# Patient Record
Sex: Male | Born: 1960 | Race: White | Hispanic: No | Marital: Married | Smoking: Former smoker
Health system: Southern US, Community
[De-identification: ages and names within clinical notes are randomized; demographics above are authoritative.]

## PROBLEM LIST (undated history)

## (undated) DIAGNOSIS — T4145XA Adverse effect of unspecified anesthetic, initial encounter: Secondary | ICD-10-CM

## (undated) DIAGNOSIS — I10 Essential (primary) hypertension: Secondary | ICD-10-CM

## (undated) DIAGNOSIS — T8859XA Other complications of anesthesia, initial encounter: Secondary | ICD-10-CM

## (undated) DIAGNOSIS — Z8719 Personal history of other diseases of the digestive system: Secondary | ICD-10-CM

## (undated) DIAGNOSIS — R112 Nausea with vomiting, unspecified: Secondary | ICD-10-CM

## (undated) DIAGNOSIS — I209 Angina pectoris, unspecified: Secondary | ICD-10-CM

## (undated) DIAGNOSIS — F419 Anxiety disorder, unspecified: Secondary | ICD-10-CM

## (undated) DIAGNOSIS — K219 Gastro-esophageal reflux disease without esophagitis: Secondary | ICD-10-CM

## (undated) DIAGNOSIS — Z9889 Other specified postprocedural states: Secondary | ICD-10-CM

## (undated) DIAGNOSIS — M1990 Unspecified osteoarthritis, unspecified site: Secondary | ICD-10-CM

## (undated) DIAGNOSIS — G4730 Sleep apnea, unspecified: Secondary | ICD-10-CM

## (undated) DIAGNOSIS — I2510 Atherosclerotic heart disease of native coronary artery without angina pectoris: Secondary | ICD-10-CM

## (undated) DIAGNOSIS — Z8489 Family history of other specified conditions: Secondary | ICD-10-CM

## (undated) DIAGNOSIS — E7800 Pure hypercholesterolemia, unspecified: Secondary | ICD-10-CM

## (undated) HISTORY — PX: COLONOSCOPY W/ POLYPECTOMY: SHX1380

## (undated) HISTORY — PX: ESOPHAGOGASTRODUODENOSCOPY: SHX1529

---

## 2004-10-19 ENCOUNTER — Other Ambulatory Visit: Payer: Self-pay

## 2004-10-19 ENCOUNTER — Ambulatory Visit: Payer: Self-pay | Admitting: Cardiology

## 2004-10-19 HISTORY — PX: CORONARY ANGIOPLASTY WITH STENT PLACEMENT: SHX49

## 2005-11-29 HISTORY — PX: CARDIAC CATHETERIZATION: SHX172

## 2006-04-26 ENCOUNTER — Ambulatory Visit: Payer: Self-pay | Admitting: Cardiology

## 2010-07-13 ENCOUNTER — Ambulatory Visit: Payer: Self-pay | Admitting: Gastroenterology

## 2010-07-15 LAB — PATHOLOGY REPORT

## 2011-10-06 ENCOUNTER — Ambulatory Visit: Payer: Self-pay

## 2013-06-15 ENCOUNTER — Ambulatory Visit: Payer: Self-pay | Admitting: Gastroenterology

## 2014-01-14 ENCOUNTER — Ambulatory Visit: Payer: Self-pay | Admitting: Internal Medicine

## 2014-05-07 ENCOUNTER — Ambulatory Visit: Payer: Self-pay | Admitting: Internal Medicine

## 2014-05-20 ENCOUNTER — Ambulatory Visit: Payer: Self-pay | Admitting: Internal Medicine

## 2014-07-03 ENCOUNTER — Other Ambulatory Visit: Payer: Self-pay | Admitting: Neurosurgery

## 2014-07-29 ENCOUNTER — Encounter (HOSPITAL_COMMUNITY): Payer: Self-pay | Admitting: Pharmacy Technician

## 2014-07-30 ENCOUNTER — Encounter (HOSPITAL_COMMUNITY)
Admission: RE | Admit: 2014-07-30 | Discharge: 2014-07-30 | Disposition: A | Discharge status: Home / Self Care | Payer: BC Managed Care – PPO | Source: Ambulatory Visit | Attending: Neurosurgery | Admitting: Neurosurgery

## 2014-07-30 ENCOUNTER — Encounter (HOSPITAL_COMMUNITY): Payer: Self-pay

## 2014-07-30 ENCOUNTER — Encounter (HOSPITAL_COMMUNITY)
Admission: RE | Admit: 2014-07-30 | Discharge: 2014-07-30 | Disposition: A | Discharge status: Home / Self Care | Payer: BC Managed Care – PPO | Source: Ambulatory Visit | Attending: Anesthesiology | Admitting: Anesthesiology

## 2014-07-30 DIAGNOSIS — Z01818 Encounter for other preprocedural examination: Secondary | ICD-10-CM | POA: Diagnosis present

## 2014-07-30 DIAGNOSIS — M5126 Other intervertebral disc displacement, lumbar region: Secondary | ICD-10-CM | POA: Insufficient documentation

## 2014-07-30 DIAGNOSIS — M47817 Spondylosis without myelopathy or radiculopathy, lumbosacral region: Secondary | ICD-10-CM | POA: Insufficient documentation

## 2014-07-30 DIAGNOSIS — M48061 Spinal stenosis, lumbar region without neurogenic claudication: Secondary | ICD-10-CM | POA: Diagnosis not present

## 2014-07-30 DIAGNOSIS — Q762 Congenital spondylolisthesis: Secondary | ICD-10-CM | POA: Insufficient documentation

## 2014-07-30 HISTORY — DX: Gastro-esophageal reflux disease without esophagitis: K21.9

## 2014-07-30 HISTORY — DX: Adverse effect of unspecified anesthetic, initial encounter: T41.45XA

## 2014-07-30 HISTORY — DX: Nausea with vomiting, unspecified: R11.2

## 2014-07-30 HISTORY — DX: Other specified postprocedural states: Z98.890

## 2014-07-30 HISTORY — DX: Family history of other specified conditions: Z84.89

## 2014-07-30 HISTORY — DX: Anxiety disorder, unspecified: F41.9

## 2014-07-30 HISTORY — DX: Angina pectoris, unspecified: I20.9

## 2014-07-30 HISTORY — DX: Unspecified osteoarthritis, unspecified site: M19.90

## 2014-07-30 HISTORY — DX: Other complications of anesthesia, initial encounter: T88.59XA

## 2014-07-30 HISTORY — DX: Essential (primary) hypertension: I10

## 2014-07-30 LAB — BASIC METABOLIC PANEL
ANION GAP: 17 — AB (ref 5–15)
BUN: 13 mg/dL (ref 6–23)
CO2: 22 meq/L (ref 19–32)
CREATININE: 0.68 mg/dL (ref 0.50–1.35)
Calcium: 9.8 mg/dL (ref 8.4–10.5)
Chloride: 101 mEq/L (ref 96–112)
GFR calc Af Amer: >90 mL/min (ref >90)
GFR calc non Af Amer: >90 mL/min (ref >90)
GLUCOSE: 90 mg/dL (ref 70–99)
Potassium: 4.2 mEq/L (ref 3.7–5.3)
Sodium: 140 mEq/L (ref 137–147)

## 2014-07-30 LAB — CBC
HCT: 45.8 % (ref 39.0–52.0)
HEMOGLOBIN: 15.2 g/dL (ref 13.0–17.0)
MCH: 30.5 pg (ref 26.0–34.0)
MCHC: 33.2 g/dL (ref 30.0–36.0)
MCV: 92 fL (ref 78.0–100.0)
Platelets: 215 10*3/uL (ref 150–400)
RBC: 4.98 MIL/uL (ref 4.22–5.81)
RDW: 12.8 % (ref 11.5–15.5)
WBC: 10.5 10*3/uL (ref 4.0–10.5)

## 2014-07-30 LAB — SURGICAL PCR SCREEN
MRSA, PCR: NEGATIVE
STAPHYLOCOCCUS AUREUS: POSITIVE — AB

## 2014-07-30 NOTE — Progress Notes (Addendum)
Keith Chan had cardiac stent in 2005.  Patient's cardiologist is Dr Josefa Half, patient is to stop Plavix and Aspirin 5 days prior to surgery.  I have requested last office note , EKGs Stress Test and Echo from his office.

## 2014-07-30 NOTE — Progress Notes (Signed)
07/30/14 1553  OBSTRUCTIVE SLEEP APNEA  Have you ever been diagnosed with sleep apnea through a sleep study? No  Do you snore loudly (loud enough to be heard through closed doors)?  1  Do you often feel tired, fatigued, or sleepy during the daytime? 1  Has anyone observed you stop breathing during your sleep? 0  Do you have, or are you being treated for high blood pressure? 1  BMI more than 35 kg/m2? 0  Age over 54 years old? 1  Neck circumference greater than 40 cm/16 inches? 1  Gender: 1  Obstructive Sleep Apnea Score 6  Score 4 or greater  Results sent to PCP

## 2014-07-30 NOTE — Pre-Procedure Instructions (Addendum)
[facility]  07/30/2014   Your procedure is scheduled on:  WEDNESDAY, SEPTEMBER 9.  Report to [facility] Admitting at 6:30AM.  Call this number if you have problems the morning of surgery: [phone]   Remember:   Do not eat food or drink liquids after midnight Tuesday, September 8.  Take these medicines the morning of surgery with A SIP OF WATER: atenolol (TENORMIN), pantoprazole (PROTONIX), amLODipine (NORVASC).       Do not wear jewelry, make-up or nail polish.  Do not wear lotions, powders, or perfumes.  Men may shave face and neck.  Do not bring valuables to the hospital.              [facility] is not responsible for any belongings or valuables.               Contacts, dentures or bridgework may not be worn into surgery.  Leave suitcase in the car. After surgery it may be brought to your room.  For patients admitted to the hospital, discharge time is determined by your treatment team.               Patients discharged the day of surgery will not be allowed to drive home.  Name and phone number of your driver: -   Special Instructions: Review  [facility] - Preparing For Surgery.   Please read over the following fact sheets that you were given: Pain Booklet, Coughing and Deep Breathing, Blood Transfusion Information and Surgical Site Infection Prevention

## 2014-08-06 NOTE — Anesthesia Preprocedure Evaluation (Addendum)
Anesthesia Evaluation  Patient identified by MRN, date of birth, ID band Patient awake    Reviewed: Allergy & Precautions, H&P , NPO status , Patient's Chart, lab work & pertinent test results  History of Anesthesia Complications (+) PONV  Airway Mallampati: II TM Distance: >3 FB Neck ROM: Full    Dental  (+) Dental Advisory Given, Teeth Intact   Pulmonary former smoker (quit 1982),  breath sounds clear to auscultation        Cardiovascular hypertension, Pt. on medications + angina (Stent 2005, on PLAVIX, to stop5 days prior, [facility]) Rhythm:Regular Rate:Normal     Neuro/Psych    GI/Hepatic GERD-  Medicated,  Endo/Other    Renal/GU      Musculoskeletal  (+) Arthritis -,   Abdominal (+)  Abdomen: soft.    Peds  Hematology   Anesthesia Other Findings   Reproductive/Obstetrics                          Anesthesia Physical Anesthesia Plan  ASA: III  Anesthesia Plan: General   Post-op Pain Management:    Induction: Intravenous  Airway Management Planned: Oral ETT  Additional Equipment:   Intra-op Plan:   Post-operative Plan: Extubation in OR  Informed Consent: I have reviewed the patients History and Physical, chart, labs and discussed the procedure including the risks, benefits and alternatives for the proposed anesthesia with the patient or authorized representative who has indicated his/her understanding and acceptance.     Plan Discussed with:   Anesthesia Plan Comments:         Anesthesia Quick Evaluation

## 2014-08-07 ENCOUNTER — Encounter (HOSPITAL_COMMUNITY): Payer: BC Managed Care – PPO | Admitting: Anesthesiology

## 2014-08-07 ENCOUNTER — Inpatient Hospital Stay (HOSPITAL_COMMUNITY): Payer: BC Managed Care – PPO | Admitting: Anesthesiology

## 2014-08-07 ENCOUNTER — Encounter (HOSPITAL_COMMUNITY)
Admission: RE | Disposition: A | Discharge status: Home / Self Care | Payer: BC Managed Care – PPO | Source: Ambulatory Visit | Attending: Neurosurgery

## 2014-08-07 ENCOUNTER — Encounter (HOSPITAL_COMMUNITY): Payer: Self-pay | Admitting: *Deleted

## 2014-08-07 ENCOUNTER — Inpatient Hospital Stay (HOSPITAL_COMMUNITY): Payer: BC Managed Care – PPO

## 2014-08-07 ENCOUNTER — Inpatient Hospital Stay (HOSPITAL_COMMUNITY)
Admission: RE | Admit: 2014-08-07 | Discharge: 2014-08-10 | DRG: 460 | Disposition: A | Discharge status: Home / Self Care | Payer: BC Managed Care – PPO | Source: Ambulatory Visit | Attending: Neurosurgery | Admitting: Neurosurgery

## 2014-08-07 DIAGNOSIS — M47817 Spondylosis without myelopathy or radiculopathy, lumbosacral region: Secondary | ICD-10-CM | POA: Diagnosis present

## 2014-08-07 DIAGNOSIS — Z87891 Personal history of nicotine dependence: Secondary | ICD-10-CM | POA: Diagnosis not present

## 2014-08-07 DIAGNOSIS — M5126 Other intervertebral disc displacement, lumbar region: Secondary | ICD-10-CM | POA: Diagnosis present

## 2014-08-07 DIAGNOSIS — Z79899 Other long term (current) drug therapy: Secondary | ICD-10-CM | POA: Diagnosis not present

## 2014-08-07 DIAGNOSIS — I10 Essential (primary) hypertension: Secondary | ICD-10-CM | POA: Diagnosis present

## 2014-08-07 DIAGNOSIS — Z7902 Long term (current) use of antithrombotics/antiplatelets: Secondary | ICD-10-CM

## 2014-08-07 DIAGNOSIS — M129 Arthropathy, unspecified: Secondary | ICD-10-CM | POA: Diagnosis present

## 2014-08-07 DIAGNOSIS — Z9861 Coronary angioplasty status: Secondary | ICD-10-CM | POA: Diagnosis not present

## 2014-08-07 DIAGNOSIS — Z7982 Long term (current) use of aspirin: Secondary | ICD-10-CM

## 2014-08-07 DIAGNOSIS — F411 Generalized anxiety disorder: Secondary | ICD-10-CM | POA: Diagnosis present

## 2014-08-07 DIAGNOSIS — Q762 Congenital spondylolisthesis: Secondary | ICD-10-CM

## 2014-08-07 DIAGNOSIS — K219 Gastro-esophageal reflux disease without esophagitis: Secondary | ICD-10-CM | POA: Diagnosis present

## 2014-08-07 DIAGNOSIS — Z885 Allergy status to narcotic agent status: Secondary | ICD-10-CM | POA: Diagnosis not present

## 2014-08-07 DIAGNOSIS — M48061 Spinal stenosis, lumbar region without neurogenic claudication: Secondary | ICD-10-CM | POA: Diagnosis present

## 2014-08-07 HISTORY — PX: POSTERIOR LUMBAR FUSION: SHX6036

## 2014-08-07 HISTORY — DX: Pure hypercholesterolemia, unspecified: E78.00

## 2014-08-07 HISTORY — DX: Personal history of other diseases of the digestive system: Z87.19

## 2014-08-07 LAB — TYPE AND SCREEN
ABO/RH(D): O POS
ANTIBODY SCREEN: NEGATIVE

## 2014-08-07 LAB — ABO/RH: ABO/RH(D): O POS

## 2014-08-07 SURGERY — POSTERIOR LUMBAR FUSION 2 LEVEL
Anesthesia: General | Site: Back

## 2014-08-07 MED ORDER — PROMETHAZINE HCL 25 MG/ML IJ SOLN: 6.2500 mg | INTRAMUSCULAR | Status: DC | PRN

## 2014-08-07 MED ORDER — LACTATED RINGERS IV SOLN
INTRAVENOUS | Status: DC | PRN
  Administered 2014-08-07 (×2): via INTRAVENOUS

## 2014-08-07 MED ORDER — PANTOPRAZOLE SODIUM 40 MG PO TBEC
40.0000 mg | DELAYED_RELEASE_TABLET | Freq: Every day | ORAL | Status: DC
  Administered 2014-08-08 – 2014-08-10 (×3): 40 mg via ORAL
  Filled 2014-08-07 (×3): qty 1

## 2014-08-07 MED ORDER — ROCURONIUM BROMIDE 50 MG/5ML IV SOLN
INTRAVENOUS | Status: AC
  Filled 2014-08-07: qty 2

## 2014-08-07 MED ORDER — MIDAZOLAM HCL 2 MG/2ML IJ SOLN
INTRAMUSCULAR | Status: AC
  Filled 2014-08-07: qty 2

## 2014-08-07 MED ORDER — ONDANSETRON HCL 4 MG/2ML IJ SOLN: INTRAMUSCULAR | Status: DC | PRN

## 2014-08-07 MED ORDER — LIDOCAINE HCL (CARDIAC) 20 MG/ML IV SOLN
INTRAVENOUS | Status: AC
Start: 2014-08-07 — End: 2014-08-07
  Filled 2014-08-07: qty 5

## 2014-08-07 MED ORDER — ARTIFICIAL TEARS OP OINT
TOPICAL_OINTMENT | OPHTHALMIC | Status: DC | PRN
  Administered 2014-08-07: 1 via OPHTHALMIC

## 2014-08-07 MED ORDER — AMLODIPINE BESYLATE 5 MG PO TABS
5.0000 mg | ORAL_TABLET | Freq: Every day | ORAL | Status: DC
  Administered 2014-08-07 – 2014-08-10 (×4): 5 mg via ORAL
  Filled 2014-08-07 (×4): qty 1

## 2014-08-07 MED ORDER — PHENOL 1.4 % MT LIQD: 1.0000 | OROMUCOSAL | Status: DC | PRN

## 2014-08-07 MED ORDER — ROCURONIUM BROMIDE 100 MG/10ML IV SOLN
INTRAVENOUS | Status: DC | PRN
  Administered 2014-08-07 (×2): 10 mg via INTRAVENOUS
  Administered 2014-08-07: 20 mg via INTRAVENOUS
  Administered 2014-08-07: 10 mg via INTRAVENOUS
  Administered 2014-08-07: 50 mg via INTRAVENOUS
  Administered 2014-08-07: 10 mg via INTRAVENOUS

## 2014-08-07 MED ORDER — ROCURONIUM BROMIDE 50 MG/5ML IV SOLN
INTRAVENOUS | Status: AC
Start: 2014-08-07 — End: 2014-08-07
  Filled 2014-08-07: qty 2

## 2014-08-07 MED ORDER — LIDOCAINE HCL (CARDIAC) 20 MG/ML IV SOLN
INTRAVENOUS | Status: DC | PRN
  Administered 2014-08-07: 100 mg via INTRAVENOUS

## 2014-08-07 MED ORDER — FENTANYL CITRATE 0.05 MG/ML IJ SOLN
INTRAMUSCULAR | Status: AC
  Administered 2014-08-07: 50 ug via INTRAVENOUS
  Filled 2014-08-07: qty 2

## 2014-08-07 MED ORDER — PHENYLEPHRINE HCL 10 MG/ML IJ SOLN
INTRAMUSCULAR | Status: DC | PRN
  Administered 2014-08-07 (×2): 80 ug via INTRAVENOUS
  Administered 2014-08-07: 120 ug via INTRAVENOUS

## 2014-08-07 MED ORDER — LIDOCAINE-EPINEPHRINE 1 %-1:100000 IJ SOLN
INTRAMUSCULAR | Status: DC | PRN
  Administered 2014-08-07: 10 mL via INTRADERMAL

## 2014-08-07 MED ORDER — DEXAMETHASONE SODIUM PHOSPHATE 10 MG/ML IJ SOLN: INTRAMUSCULAR | Status: DC | PRN

## 2014-08-07 MED ORDER — FENTANYL CITRATE 0.05 MG/ML IJ SOLN
25.0000 ug | INTRAMUSCULAR | Status: DC | PRN
  Administered 2014-08-07: 50 ug via INTRAVENOUS

## 2014-08-07 MED ORDER — SODIUM CHLORIDE 0.9 % IR SOLN
Status: DC | PRN
  Administered 2014-08-07: 10:00:00

## 2014-08-07 MED ORDER — ONDANSETRON HCL 4 MG/2ML IJ SOLN: 4.0000 mg | INTRAMUSCULAR | Status: DC | PRN

## 2014-08-07 MED ORDER — NEOSTIGMINE METHYLSULFATE 10 MG/10ML IV SOLN
INTRAVENOUS | Status: AC
  Filled 2014-08-07: qty 1

## 2014-08-07 MED ORDER — HYDROMORPHONE HCL PF 1 MG/ML IJ SOLN
0.5000 mg | INTRAMUSCULAR | Status: DC | PRN
  Administered 2014-08-07 (×2): 1 mg via INTRAVENOUS
  Filled 2014-08-07 (×2): qty 1

## 2014-08-07 MED ORDER — SODIUM CHLORIDE 0.9 % IJ SOLN: 3.0000 mL | INTRAMUSCULAR | Status: DC | PRN

## 2014-08-07 MED ORDER — OXYCODONE-ACETAMINOPHEN 5-325 MG PO TABS: 1.0000 | ORAL_TABLET | ORAL | Status: DC | PRN

## 2014-08-07 MED ORDER — CEFAZOLIN SODIUM 1-5 GM-% IV SOLN
1.0000 g | Freq: Three times a day (TID) | INTRAVENOUS | Status: AC
  Administered 2014-08-07 – 2014-08-09 (×6): 1 g via INTRAVENOUS
  Filled 2014-08-07 (×9): qty 50

## 2014-08-07 MED ORDER — CLOPIDOGREL BISULFATE 75 MG PO TABS
75.0000 mg | ORAL_TABLET | Freq: Every day | ORAL | Status: DC
  Administered 2014-08-08 – 2014-08-10 (×3): 75 mg via ORAL
  Filled 2014-08-07 (×3): qty 1

## 2014-08-07 MED ORDER — ONDANSETRON HCL 4 MG/2ML IJ SOLN
INTRAMUSCULAR | Status: AC
  Filled 2014-08-07: qty 4

## 2014-08-07 MED ORDER — SODIUM CHLORIDE 0.9 % IJ SOLN
3.0000 mL | Freq: Two times a day (BID) | INTRAMUSCULAR | Status: DC
  Administered 2014-08-08 – 2014-08-09 (×3): 3 mL via INTRAVENOUS

## 2014-08-07 MED ORDER — MIDAZOLAM HCL 5 MG/5ML IJ SOLN
INTRAMUSCULAR | Status: DC | PRN
  Administered 2014-08-07: 2 mg via INTRAVENOUS

## 2014-08-07 MED ORDER — SODIUM CHLORIDE 0.9 % IV SOLN
250.0000 mL | INTRAVENOUS | Status: DC
Start: 2014-08-07 — End: 2014-08-10

## 2014-08-07 MED ORDER — FENTANYL CITRATE 0.05 MG/ML IJ SOLN
INTRAMUSCULAR | Status: AC
  Filled 2014-08-07: qty 5

## 2014-08-07 MED ORDER — GLYCOPYRROLATE 0.2 MG/ML IJ SOLN
INTRAMUSCULAR | Status: DC | PRN
  Administered 2014-08-07: 0.6 mg via INTRAVENOUS

## 2014-08-07 MED ORDER — NEOSTIGMINE METHYLSULFATE 10 MG/10ML IV SOLN
INTRAVENOUS | Status: DC | PRN
  Administered 2014-08-07: 4 mg via INTRAVENOUS

## 2014-08-07 MED ORDER — ATENOLOL 100 MG PO TABS
100.0000 mg | ORAL_TABLET | Freq: Every day | ORAL | Status: DC
  Administered 2014-08-08 – 2014-08-10 (×3): 100 mg via ORAL
  Filled 2014-08-07 (×3): qty 1

## 2014-08-07 MED ORDER — SCOPOLAMINE 1 MG/3DAYS TD PT72
1.0000 | MEDICATED_PATCH | TRANSDERMAL | Status: DC
  Administered 2014-08-07: 1.5 mg via TRANSDERMAL

## 2014-08-07 MED ORDER — PROPOFOL 10 MG/ML IV BOLUS
INTRAVENOUS | Status: DC | PRN
  Administered 2014-08-07: 150 mg via INTRAVENOUS
  Administered 2014-08-07: 50 mg via INTRAVENOUS

## 2014-08-07 MED ORDER — CEFAZOLIN SODIUM-DEXTROSE 2-3 GM-% IV SOLR
INTRAVENOUS | Status: AC
  Administered 2014-08-07: 2 g via INTRAVENOUS
  Filled 2014-08-07: qty 50

## 2014-08-07 MED ORDER — DEXMEDETOMIDINE HCL IN NACL 200 MCG/50ML IV SOLN
INTRAVENOUS | Status: AC
  Filled 2014-08-07: qty 50

## 2014-08-07 MED ORDER — ACETAMINOPHEN 10 MG/ML IV SOLN
INTRAVENOUS | Status: AC
  Administered 2014-08-07: 1000 mg via INTRAVENOUS
  Filled 2014-08-07: qty 100

## 2014-08-07 MED ORDER — PROPOFOL 10 MG/ML IV BOLUS
INTRAVENOUS | Status: AC
Start: 2014-08-07 — End: 2014-08-07
  Filled 2014-08-07: qty 20

## 2014-08-07 MED ORDER — ASPIRIN 81 MG PO CHEW
81.0000 mg | CHEWABLE_TABLET | Freq: Every day | ORAL | Status: DC
  Administered 2014-08-08 – 2014-08-10 (×3): 81 mg via ORAL
  Filled 2014-08-07 (×3): qty 1

## 2014-08-07 MED ORDER — DEXMEDETOMIDINE HCL 200 MCG/2ML IV SOLN
INTRAVENOUS | Status: DC | PRN
  Administered 2014-08-07: 12 ug via INTRAVENOUS
  Administered 2014-08-07 (×2): 8 ug via INTRAVENOUS
  Administered 2014-08-07: 4 ug via INTRAVENOUS
  Administered 2014-08-07: 8 ug via INTRAVENOUS

## 2014-08-07 MED ORDER — DEXAMETHASONE SODIUM PHOSPHATE 10 MG/ML IJ SOLN
INTRAMUSCULAR | Status: AC
  Administered 2014-08-07: 10 mg via INTRAVENOUS
  Filled 2014-08-07: qty 1

## 2014-08-07 MED ORDER — PHENYLEPHRINE HCL 10 MG/ML IJ SOLN
10.0000 mg | INTRAMUSCULAR | Status: DC | PRN
  Administered 2014-08-07: 20 ug/min via INTRAVENOUS

## 2014-08-07 MED ORDER — ATORVASTATIN CALCIUM 10 MG PO TABS
20.0000 mg | ORAL_TABLET | Freq: Every day | ORAL | Status: DC
  Administered 2014-08-08 – 2014-08-09 (×2): 20 mg via ORAL
  Filled 2014-08-07 (×2): qty 2

## 2014-08-07 MED ORDER — ARTIFICIAL TEARS OP OINT
TOPICAL_OINTMENT | OPHTHALMIC | Status: AC
Start: 2014-08-07 — End: 2014-08-07
  Filled 2014-08-07: qty 3.5

## 2014-08-07 MED ORDER — FENTANYL CITRATE 0.05 MG/ML IJ SOLN
INTRAMUSCULAR | Status: DC | PRN
  Administered 2014-08-07 (×2): 50 ug via INTRAVENOUS
  Administered 2014-08-07: 100 ug via INTRAVENOUS
  Administered 2014-08-07: 50 ug via INTRAVENOUS

## 2014-08-07 MED ORDER — SCOPOLAMINE 1 MG/3DAYS TD PT72
MEDICATED_PATCH | TRANSDERMAL | Status: AC
  Filled 2014-08-07: qty 1

## 2014-08-07 MED ORDER — 0.9 % SODIUM CHLORIDE (POUR BTL) OPTIME
TOPICAL | Status: DC | PRN
  Administered 2014-08-07: 1000 mL

## 2014-08-07 MED ORDER — ONDANSETRON HCL 4 MG/2ML IJ SOLN
INTRAMUSCULAR | Status: DC | PRN
  Administered 2014-08-07 (×2): 4 mg via INTRAVENOUS

## 2014-08-07 MED ORDER — ACETAMINOPHEN 325 MG PO TABS
650.0000 mg | ORAL_TABLET | ORAL | Status: DC | PRN
Start: 2014-08-07 — End: 2014-08-10

## 2014-08-07 MED ORDER — ACETAMINOPHEN 650 MG RE SUPP: 650.0000 mg | RECTAL | Status: DC | PRN

## 2014-08-07 MED ORDER — MEPERIDINE HCL 25 MG/ML IJ SOLN: 6.2500 mg | INTRAMUSCULAR | Status: DC | PRN

## 2014-08-07 MED ORDER — HYDROMORPHONE HCL 2 MG PO TABS
1.0000 mg | ORAL_TABLET | ORAL | Status: DC | PRN
  Administered 2014-08-07 – 2014-08-10 (×12): 2 mg via ORAL
  Filled 2014-08-07 (×13): qty 1

## 2014-08-07 MED ORDER — CYCLOBENZAPRINE HCL 10 MG PO TABS
10.0000 mg | ORAL_TABLET | Freq: Three times a day (TID) | ORAL | Status: DC | PRN
  Administered 2014-08-08 – 2014-08-10 (×4): 10 mg via ORAL
  Filled 2014-08-07 (×4): qty 1

## 2014-08-07 MED ORDER — MENTHOL 3 MG MT LOZG: 1.0000 | LOZENGE | OROMUCOSAL | Status: DC | PRN

## 2014-08-07 MED ORDER — THROMBIN 20000 UNITS EX SOLR
CUTANEOUS | Status: DC | PRN
  Administered 2014-08-07 (×2): via TOPICAL

## 2014-08-07 MED ORDER — GLYCOPYRROLATE 0.2 MG/ML IJ SOLN
INTRAMUSCULAR | Status: AC
  Filled 2014-08-07: qty 3

## 2014-08-07 SURGICAL SUPPLY — 80 items
ALLOSTEM STRIP 20MMX50MM (Tissue) ×3 IMPLANT
BAG DECANTER FOR FLEXI CONT (MISCELLANEOUS) ×3 IMPLANT
BENZOIN TINCTURE PRP APPL 2/3 (GAUZE/BANDAGES/DRESSINGS) ×3 IMPLANT
BIT DRILL 5.0/4.0 (BIT) ×1 IMPLANT
BLADE SURG 11 STRL SS (BLADE) ×3 IMPLANT
BLADE SURG ROTATE 9660 (MISCELLANEOUS) IMPLANT
BONE ALLOSTEM MORSELIZED 5CC (Bone Implant) ×3 IMPLANT
BRUSH SCRUB EZ PLAIN DRY (MISCELLANEOUS) ×3 IMPLANT
BUR MATCHSTICK NEURO 3.0 LAGG (BURR) ×3 IMPLANT
BUR PRECISION FLUTE 6.0 (BURR) ×3 IMPLANT
CAGE RISE 11-17-15 10X26 (Cage) ×12 IMPLANT
CANISTER SUCT 3000ML (MISCELLANEOUS) ×3 IMPLANT
CAP LOCKING (Cap) ×12 IMPLANT
CAP LOCKING 5.5 CREO (Cap) ×6 IMPLANT
CLOSURE WOUND 1/2 X4 (GAUZE/BANDAGES/DRESSINGS) ×1
CONT SPEC 4OZ CLIKSEAL STRL BL (MISCELLANEOUS) ×6 IMPLANT
COVER BACK TABLE 24X17X13 BIG (DRAPES) IMPLANT
COVER TABLE BACK 60X90 (DRAPES) ×3 IMPLANT
DECANTER SPIKE VIAL GLASS SM (MISCELLANEOUS) ×3 IMPLANT
DERMABOND ADVANCED (GAUZE/BANDAGES/DRESSINGS) ×2
DERMABOND ADVANCED .7 DNX12 (GAUZE/BANDAGES/DRESSINGS) ×1 IMPLANT
DRAPE C-ARM 42X72 X-RAY (DRAPES) ×3 IMPLANT
DRAPE C-ARMOR (DRAPES) ×3 IMPLANT
DRAPE LAPAROTOMY 100X72X124 (DRAPES) ×3 IMPLANT
DRAPE POUCH INSTRU U-SHP 10X18 (DRAPES) ×3 IMPLANT
DRAPE PROXIMA HALF (DRAPES) IMPLANT
DRAPE SURG 17X23 STRL (DRAPES) ×3 IMPLANT
DRILL 5.0/4.0 (BIT) ×3
DRSG OPSITE 4X5.5 SM (GAUZE/BANDAGES/DRESSINGS) ×3 IMPLANT
DRSG OPSITE POSTOP 4X6 (GAUZE/BANDAGES/DRESSINGS) ×3 IMPLANT
DURAPREP 26ML APPLICATOR (WOUND CARE) ×3 IMPLANT
ELECT REM PT RETURN 9FT ADLT (ELECTROSURGICAL) ×3
ELECTRODE REM PT RTRN 9FT ADLT (ELECTROSURGICAL) ×1 IMPLANT
EVACUATOR 3/16  PVC DRAIN (DRAIN) ×2
EVACUATOR 3/16 PVC DRAIN (DRAIN) ×1 IMPLANT
GAUZE SPONGE 4X4 12PLY STRL (GAUZE/BANDAGES/DRESSINGS) ×3 IMPLANT
GAUZE SPONGE 4X4 16PLY XRAY LF (GAUZE/BANDAGES/DRESSINGS) ×3 IMPLANT
GLOVE BIO SURGEON STRL SZ8 (GLOVE) ×6 IMPLANT
GLOVE BIOGEL PI IND STRL 7.5 (GLOVE) ×2 IMPLANT
GLOVE BIOGEL PI INDICATOR 7.5 (GLOVE) ×4
GLOVE ECLIPSE 6.5 STRL STRAW (GLOVE) ×3 IMPLANT
GLOVE ECLIPSE 7.5 STRL STRAW (GLOVE) ×6 IMPLANT
GLOVE EXAM NITRILE LRG STRL (GLOVE) IMPLANT
GLOVE EXAM NITRILE MD LF STRL (GLOVE) IMPLANT
GLOVE EXAM NITRILE XL STR (GLOVE) IMPLANT
GLOVE EXAM NITRILE XS STR PU (GLOVE) IMPLANT
GLOVE INDICATOR 8.5 STRL (GLOVE) ×6 IMPLANT
GLOVE SURG SS PI 7.0 STRL IVOR (GLOVE) ×6 IMPLANT
GOWN STRL REUS W/ TWL LRG LVL3 (GOWN DISPOSABLE) ×2 IMPLANT
GOWN STRL REUS W/ TWL XL LVL3 (GOWN DISPOSABLE) ×3 IMPLANT
GOWN STRL REUS W/TWL 2XL LVL3 (GOWN DISPOSABLE) IMPLANT
GOWN STRL REUS W/TWL LRG LVL3 (GOWN DISPOSABLE) ×4
GOWN STRL REUS W/TWL XL LVL3 (GOWN DISPOSABLE) ×6
KIT BASIN OR (CUSTOM PROCEDURE TRAY) ×3 IMPLANT
KIT ROOM TURNOVER OR (KITS) ×3 IMPLANT
MILL MEDIUM DISP (BLADE) ×3 IMPLANT
NEEDLE HYPO 25X1 1.5 SAFETY (NEEDLE) ×3 IMPLANT
NS IRRIG 1000ML POUR BTL (IV SOLUTION) ×3 IMPLANT
PACK LAMINECTOMY NEURO (CUSTOM PROCEDURE TRAY) ×3 IMPLANT
PAD ARMBOARD 7.5X6 YLW CONV (MISCELLANEOUS) ×9 IMPLANT
PATTIES SURGICAL 1X1 (DISPOSABLE) ×3 IMPLANT
ROD 65MM SPINAL (Rod) ×2 IMPLANT
ROD SPNL 5.5 CREO TI 65 (Rod) ×1 IMPLANT
SCREW CORT CREO 6.0-5.0X30MM (Screw) ×6 IMPLANT
SCREW CORT CREO 6.5-5.5X35 (Screw) ×6 IMPLANT
SCREW MOD 6.0-5.0X35MM (Screw) ×6 IMPLANT
SPONGE LAP 4X18 X RAY DECT (DISPOSABLE) IMPLANT
SPONGE SURGIFOAM ABS GEL 100 (HEMOSTASIS) ×6 IMPLANT
STRIP CLOSURE SKIN 1/2X4 (GAUZE/BANDAGES/DRESSINGS) ×2 IMPLANT
SUT VIC AB 0 CT1 18XCR BRD8 (SUTURE) ×1 IMPLANT
SUT VIC AB 0 CT1 8-18 (SUTURE) ×2
SUT VIC AB 2-0 CT1 18 (SUTURE) ×3 IMPLANT
SUT VICRYL 4-0 PS2 18IN ABS (SUTURE) ×3 IMPLANT
SYR 20ML ECCENTRIC (SYRINGE) ×3 IMPLANT
TOWEL OR 17X24 6PK STRL BLUE (TOWEL DISPOSABLE) ×3 IMPLANT
TOWEL OR 17X26 10 PK STRL BLUE (TOWEL DISPOSABLE) ×3 IMPLANT
TRAY FOLEY CATH 14FRSI W/METER (CATHETERS) IMPLANT
TRAY FOLEY CATH 16FRSI W/METER (SET/KITS/TRAYS/PACK) ×3 IMPLANT
TULIP CREP AMP 5.5MM (Orthopedic Implant) ×6 IMPLANT
WATER STERILE IRR 1000ML POUR (IV SOLUTION) ×3 IMPLANT

## 2014-08-07 NOTE — H&P (Signed)
Keith Chan is an 53 y.o. male.   Chief Complaint: Back and right greater left leg pain HPI: Patient is a pleasant 53 to them is a progress worsening back and bilateral leg pain worse on the radiating down the back and outside of his thigh back and outside of his calf and his foot both involving the top as it is big toe as well as the outside in bottom of his foot. This is been refractory to all forms of conservative treatment and including physical therapy epidural steroid injections anti-inflammatories and narcotic pain management and time. Workup revealed spondylosis and disc disease at L4-5 L5-S1 with a grade 1 spondylolisthesis at L5-S1. It also revealed transitional anatomy with a router disc that were: S1-S2 disc. So due to this takes her treatment imaging findings and progression of clinical syndrome I recommended decompression and fusion to the spaces above the transitional level that we are going to call L4-5 and L5-S1. I have extensively reviewed the risks and benefits of the operation the patient as well as perioperative course expectations of outcome and alternatives to surgery and he understands and agrees to proceed forward.  Past Medical History  Diagnosis Date  . Complication of anesthesia   . PONV (postoperative nausea and vomiting)   . Family history of anesthesia complication     Mother- N/V  . Anginal pain   . Hypertension   . Anxiety   . GERD (gastroesophageal reflux disease)   . Arthritis     Past Surgical History  Procedure Laterality Date  . Cardiac catheterization    . Coronary angioplasty    . Colonoscopy w/ polypectomy    . Coronary angioplasty with stent placement  10/19/04    MId- Prox RCA  @ [facility]    History reviewed. No pertinent family history. Social History:  reports that he has quit smoking. He does not have any smokeless tobacco history on file. He reports that he drinks about 14.4 ounces of alcohol per week. He reports that he does not use illicit  drugs.  Allergies:  Allergies  Allergen Reactions  . Codeine Anaphylaxis    "throat closes up"    Medications Prior to Admission  Medication Sig Dispense Refill  . amLODipine (NORVASC) 5 MG tablet Take 5 mg by mouth daily.      Marland Kitchen aspirin 81 MG chewable tablet Chew 81 mg by mouth daily.      Marland Kitchen atenolol (TENORMIN) 100 MG tablet Take 100 mg by mouth daily.      Marland Kitchen atorvastatin (LIPITOR) 20 MG tablet Take 20 mg by mouth daily.      . clopidogrel (PLAVIX) 75 MG tablet Take 75 mg by mouth daily.      Marland Kitchen ibuprofen (ADVIL,MOTRIN) 200 MG tablet Take 400 mg by mouth 3 (three) times daily as needed for mild pain or moderate pain.      . pantoprazole (PROTONIX) 40 MG tablet Take 40 mg by mouth daily.        No results found for this or any previous visit (from the past 48 hour(s)). No results found.  Review of Systems  Constitutional: Negative.   HENT: Negative.   Eyes: Negative.   Respiratory: Negative.   Cardiovascular: Negative.   Gastrointestinal: Negative.   Genitourinary: Negative.   Musculoskeletal: Positive for back pain and myalgias.  Skin: Negative.   Neurological: Negative.   Psychiatric/Behavioral: Negative.     Blood pressure 167/98, pulse 75, temperature 98.6 F (37 C), temperature source Oral, resp.  rate 18, weight 75.297 kg (166 lb), SpO2 97.00%. Physical Exam  Constitutional: He is oriented to person, place, and time. He appears well-developed and well-nourished.  HENT:  Head: Normocephalic.  Eyes: Pupils are equal, round, and reactive to light.  Neck: Normal range of motion.  Cardiovascular: Normal rate.   Respiratory: Effort normal.  GI: Soft.  Neurological: He is alert and oriented to person, place, and time. He has normal strength. GCS eye subscore is 4. GCS verbal subscore is 5. GCS motor subscore is 6.  Strength is 5 out of 5 in his iliopsoas, quads, and she's, gastrocs, into tibialis, and EHL.  Skin: Skin is warm and dry.     Assessment/Plan 53 year old  gentleman presents for an L4-5 L5-S1 decompression and fusion  Connell Bognar P 08/07/2014, 8:23 AM

## 2014-08-07 NOTE — Anesthesia Procedure Notes (Signed)
Procedure Name: Intubation Date/Time: 08/07/2014 8:41 AM Performed by: Trixie Deis A Pre-anesthesia Checklist: Patient identified, Timeout performed and Emergency Drugs available Patient Re-evaluated:Patient Re-evaluated prior to inductionOxygen Delivery Method: Circle system utilized Preoxygenation: Pre-oxygenation with 100% oxygen Intubation Type: IV induction Ventilation: Mask ventilation without difficulty Laryngoscope Size: Mac and 3 Grade View: Grade I Tube type: Oral Tube size: 7.5 mm Number of attempts: 1 Airway Equipment and Method: Stylet Placement Confirmation: ETT inserted through vocal cords under direct vision,  breath sounds checked- equal and bilateral and positive ETCO2 Secured at: 23 cm Tube secured with: Tape Dental Injury: Teeth and Oropharynx as per pre-operative assessment and Injury to lip

## 2014-08-07 NOTE — Op Note (Signed)
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 with spondylosis stenosis and spondylolisthesis at L5-S1 with severe foraminal stenosis at L4-5 L5-S1  Postoperative diagnosis: Same  Procedure: Decompressive lumbar laminectomies L4-5 and L5-S1 complete medial facetectomies radical foraminotomies of the L4, L5, and S1 nerve root in excess and requiring more work than would be needed with a standard interbody fusion  #2 posterior lumbar interbody fusion L4-5 L5-S1 using the globus rise expandable peek cages packed with local autograft mixed with allostem morsels and strips  #3 pedicle screw fixation using the globus cortical Creo screw system from L4-S1  #4 placement of a large Hemovac drain  Surgeon: Dominica Severin Burnice Oestreicher  Assistant: Dr. Ashok Pall  Anesthesia: Gen.  EBL: Minimal  History of present illness: 53 year old gentleman has had long-standing back and bilateral leg pain worse on the right refractory to all forms of conservative treatment workup revealed severe stenosis at L4-5 and L5-S1 with a grade 1 spinal listhesis L5-S1 and severe foraminal stenosis. The patient takes her treatment imaging findings and progression of clinical syndrome I recommended decompressive laminectomies and fusion L4-5 L5-S1 I have extensively reviewed the risks and benefits of the operation the patient as well as perioperative Course expectations about alternatives of surgery and instrument agree to proceed forward.  Operative procedure: Patient brought into the or was induced under general anesthesia and positioned prone the Wilson frame his back was prepped and draped in routine sterile fashion preoperative x-ray localize the appropriate level so after infiltration 10 cc lidocaine with epi a midline incision was made and Bovie electrocautery was used to take down the subcutaneous tissue and subperiosteal dissections care lamina of L4-L5 and S1. Interoperative x-ray confirmed that  cushion proper levels attention was first taken the placement of the L4 screws. Using both AP and lateral fluoroscopy the 5:00 and 7:00 positions were identified and the pedicle these were drilled with a 40 drill bit probed, tapped with a 50 Probed again, and 60/50 globus screws 35 mm in length were placed in each pedicle. All pedicles were accomplished improved probing with the pedicle awl AP and lateral fluoroscopy confirmed good position of the screws. After this was done troughs were drilled a high-speed drill the inferior the screw and then sent decompression was begun with removal of spinous processes at L4 and L5 and complete medial facetectomies were performed radical from an obvious deformity L4-L5 and S1 nerve roots there was marked stenosis and severe compression of both the L4 and L5 nerve roots from the super to cutting facet and marked facet arthropathy. The facets at L4-5 were also noted be diastased. Then after aggressive abutting the superior tickling facet to gain access lateral margins of disc space bilaterally all the epidural veins are coagulated. This taken the interbody work the spaces at L4-5 was cleaned out bilaterally at we'll distractor was inserted the patient's left side where the right-sided disc spaces cleanout several arthritis removed the central compartment several arthritis removed the lateral compartment underneath the L4 nerve root. Endplates were then scraped with panel shavers Epstein curettes and Scovil curettes. Then a 11 mm expandable rise cages packed with local are graft mixed with allostem morsels and inserted and expanded fluoroscopy confirmed good position of the implant distractor was removed this procedure was repeated L5-S1 again noted is extensive lateral disc herniation displacing the L5 nerve roots L5-S1 this is all removed all endplates were prepared central disc was also removed in a similar fashion same size cage was inserted and expanded  the contralateral  endplates and the space was also prepared local autograft mixed with the allostem was packed centrally and a contralateral cages were inserted. After all the implants in place attention was taken to the other screw placement using bony landmarks and fluoroscopy pilot holes were drilled L5 and S1 with a slightly lateral but straight trajectory these holes were drilled probed tapped probed again and 6050 by 30 mm screws were inserted at L5 and 6050 by 35 at S1. Wounds and copious irrigated meticulous hemostasis was maintained Ross for size and placed top tightness tightened down fluoroscopy confirmed good position of all the implants all the foraminal reinspected confirm patency no migration of graft material. Then a large abductor was placed over was laid over the dura and the muscle fascia pressure layers with after Vicryl the skin was closed running 4 subcuticular benzo and Steri-Strips were applied patient recovered in stable condition. At the end of case on it counts sponge counts were correct the

## 2014-08-07 NOTE — Transfer of Care (Signed)
Immediate Anesthesia Transfer of Care Note  Patient: Keith Chan  Procedure(s) Performed: Procedure(s) with comments: POSTERIOR LUMBAR INTERBODY FUSION LUMBAR FOUR-FIVE, FIVE-SACRAL ONE (N/A) - POSTERIOR LUMBAR INTERBODY FUSION LUMBAR FOUR-FIVE, FIVE-SACRAL ONE  Patient Location: PACU  Anesthesia Type:General  Level of Consciousness: sedated  Airway & Oxygen Therapy: Patient Spontanous Breathing and Patient connected to nasal cannula oxygen  Post-op Assessment: Report given to PACU RN and Post -op Vital signs reviewed and stable  Post vital signs: Reviewed and stable  Complications: No apparent anesthesia complications

## 2014-08-07 NOTE — Anesthesia Postprocedure Evaluation (Signed)
  Anesthesia Post-op Note  Patient: Keith Chan  Procedure(s) Performed: Procedure(s) with comments: POSTERIOR LUMBAR INTERBODY FUSION LUMBAR FOUR-FIVE, FIVE-SACRAL ONE (N/A) - POSTERIOR LUMBAR INTERBODY FUSION LUMBAR FOUR-FIVE, FIVE-SACRAL ONE  Patient Location: PACU  Anesthesia Type:General  Level of Consciousness: awake and alert   Airway and Oxygen Therapy: Patient Spontanous Breathing and Patient connected to nasal cannula oxygen  Post-op Pain: mild  Post-op Assessment: Post-op Vital signs reviewed, Patient's Cardiovascular Status Stable, Respiratory Function Stable and No signs of Nausea or vomiting  Post-op Vital Signs: Reviewed and stable  Last Vitals:  Filed Vitals:   08/07/14 1400  BP: 105/56  Pulse: 71  Temp:   Resp: 17    Complications: No apparent anesthesia complications

## 2014-08-08 MED ORDER — DOCUSATE SODIUM 100 MG PO CAPS
100.0000 mg | ORAL_CAPSULE | Freq: Two times a day (BID) | ORAL | Status: DC
  Administered 2014-08-08 – 2014-08-10 (×5): 100 mg via ORAL
  Filled 2014-08-08 (×5): qty 1

## 2014-08-08 MED ORDER — BISACODYL 10 MG RE SUPP
10.0000 mg | Freq: Once | RECTAL | Status: AC
  Administered 2014-08-08: 10 mg via RECTAL
  Filled 2014-08-08: qty 1

## 2014-08-08 MED FILL — Sodium Chloride IV Soln 0.9%: INTRAVENOUS | Qty: 1000 | Status: AC

## 2014-08-08 MED FILL — Heparin Sodium (Porcine) Inj 1000 Unit/ML: INTRAMUSCULAR | Qty: 30 | Status: AC

## 2014-08-08 MED FILL — Sodium Chloride Irrigation Soln 0.9%: Qty: 3000 | Status: AC

## 2014-08-08 NOTE — Progress Notes (Signed)
CARE MANAGEMENT NOTE 08/08/2014  Patient:  Keith Chan, Keith Chan   Account Number:  [id number]  Date Initiated:  08/08/2014  Documentation initiated by:  Olga Coaster  Subjective/Objective Assessment:   ADMITTED FOR SURGERY     Action/Plan:   CM FOLLOWING FOR DCP   Anticipated DC Date:  08/13/2014   Anticipated DC Plan:  HOME      DC Planning Services  CM consult         Status of service:  In process, will continue to follow Medicare Important Message given?   (If response is "NO", the following Medicare IM given date fields will be blank)  Per UR Regulation:  Reviewed for med. necessity/level of care/duration of stay  Comments:  9/10/2015Mindi Slicker RN,BSN,MHA [phone]

## 2014-08-08 NOTE — Progress Notes (Signed)
Subjective: Patient reports Doing well significant improvement and preoperative radicular symptoms back pain well controlled  Objective: Vital signs in last 24 hours: Temp:  [97.7 F (36.5 C)-98.8 F (37.1 C)] 98.4 F (36.9 C) (09/10 0623) Pulse Rate:  [71-94] 85 (09/10 0623) Resp:  [14-21] 18 (09/10 0623) BP: (94-127)/(53-81) 127/63 mmHg (09/10 0623) SpO2:  [92 %-97 %] 94 % (09/10 0623)  Intake/Output from previous day: 09/09 0701 - 09/10 0700 In: 2430 [P.O.:480; I.V.:1800; Blood:150] Out: 2425 [Urine:1815; Drains:160; Blood:450] Intake/Output this shift:    Strength out of 5 wound clean dry and intact  Lab Results: No results found for this basename: WBC, HGB, HCT, PLT,  in the last 72 hours BMET No results found for this basename: NA, K, CL, CO2, GLUCOSE, BUN, CREATININE, CALCIUM,  in the last 72 hours  Studies/Results: Dg Lumbar Spine 2-3 Views  08/07/2014   CLINICAL DATA:  PLIF L4-5, L5-S1  EXAM: DG C-ARM 61-120 MIN; LUMBAR SPINE - 2-3 VIEW  FLUOROSCOPY TIME:  1 min 37 seconds  COMPARISON:  None  FINDINGS: Posterior lumbar interbody fusion at L4-5 and L5-S1 with bilateral pedicle screws and intervertebral spacer devices at each level. Posterior tissue retractors are noted.  IMPRESSION: PLIF L4-S1.   Electronically Signed   By: Kathreen Devoid   On: 08/07/2014 12:50   Dg C-arm 61-120 Min  08/07/2014   CLINICAL DATA:  PLIF L4-5, L5-S1  EXAM: DG C-ARM 61-120 MIN; LUMBAR SPINE - 2-3 VIEW  FLUOROSCOPY TIME:  1 min 37 seconds  COMPARISON:  None  FINDINGS: Posterior lumbar interbody fusion at L4-5 and L5-S1 with bilateral pedicle screws and intervertebral spacer devices at each level. Posterior tissue retractors are noted.  IMPRESSION: PLIF L4-S1.   Electronically Signed   By: Kathreen Devoid   On: 08/07/2014 12:50    Assessment/Plan: Progressive mobilization today with physical and occupational therapy  LOS: 1 day     Jenilee Franey P 08/08/2014, 7:47 AM

## 2014-08-08 NOTE — Progress Notes (Signed)
Occupational Therapy Evaluation Patient Details Name: Keith Chan MRN: [id number] DOB: [birth year] Today's Date: 08/08/2014    History of Present Illness Keith Chan is a 53 y.o. male s/p PLIF 2 levels on 08/07/14. PMH of HTN, PONV, and Coronary angioplasty with stent placement (2005).    Clinical Impression   PTA pt lived at home and was independent with ADLs and functional mobility. Pt currently requires assist for LB ADLs and min guard for mobility. Pt would benefit from skilled OT to address problem areas prior to d/c home.     Follow Up Recommendations  No OT follow up    Equipment Recommendations  None recommended by OT    Recommendations for Other Services       Precautions / Restrictions Precautions Precautions: Back;Fall Precaution Booklet Issued: Yes (comment) Precaution Comments: Educated pt/wife on 3/3 back preacutions and incorporating into ADLs.  Required Braces or Orthoses: Spinal Brace Spinal Brace: Lumbar corset Restrictions Weight Bearing Restrictions: No      Mobility Bed Mobility Overal bed mobility: Needs Assistance Bed Mobility: Rolling;Sidelying to Sit Rolling: Supervision Sidelying to sit: Supervision       General bed mobility comments: Supervision for technique and safety. Pt performed without VC's.   Transfers Overall transfer level: Needs assistance Equipment used: Rolling walker (2 wheeled) Transfers: Sit to/from [facility] Sit to Stand: Min guard Stand pivot transfers: Min guard       General transfer comment: Min guard for safety due to c/o slight dizziness. VC's for hand placement and sequencing.     Balance Overall balance assessment: No apparent balance deficits (not formally assessed)                                          ADL Overall ADL's : Needs assistance/impaired Eating/Feeding: Independent;Sitting   Grooming: Min guard;Standing;Oral care;Wash/dry hands   Upper Body Bathing:  Set up;Sitting   Lower Body Bathing: Minimal assistance;Sit to/from stand Lower Body Bathing Details (indicate cue type and reason): (A) to reach Bil LEs Upper Body Dressing : Set up;Sitting (including back brace)   Lower Body Dressing: Moderate assistance;Sit to/from stand   Toilet Transfer: Min guard;Ambulation;RW     Toileting - Clothing Manipulation Details (indicate cue type and reason): educated pt on use of toilet aid (or tongs and baby wipes) for independent toilet hygiene while maintaining back precautions.     Functional mobility during ADLs: Min guard;Rolling walker General ADL Comments: Pt moving well this morning and performed bed mobility with no assist required. Pt ambulated into bathroom for toileting and performing grooming tasks standing at sink. Pt sitting upright in recliner and educated on 3/3 back precautions and incorporating them into ADLs. Educated pt on fall prevention, including proper footwear, safe use of DME, and adequate lighting.      Vision  Pt wears glasses for reading only. Pt reports no change from baseline and has no apparent deficits.                    Perception Perception Perception Tested?: No   Praxis Praxis Praxis tested?: Within functional limits    Pertinent Vitals/Pain Pain Assessment: 0-10 Pain Score: 4  Pain Intervention(s): Monitored during session;RN gave pain meds during session;Repositioned     Hand Dominance Right   Extremity/Trunk Assessment Upper Extremity Assessment Upper Extremity Assessment: Overall WFL for tasks assessed   Lower  Extremity Assessment Lower Extremity Assessment: Overall WFL for tasks assessed   Cervical / Trunk Assessment Cervical / Trunk Assessment: Normal   Communication Communication Communication: No difficulties   Cognition Arousal/Alertness: Awake/alert Behavior During Therapy: WFL for tasks assessed/performed Overall Cognitive Status: Within Functional Limits for tasks  assessed                                Home Living Family/patient expects to be discharged to:: Private residence Living Arrangements: Spouse/significant other;Children Available Help at Discharge: Family;Available 24 hours/day (wife will be home through weekend; then plans to go to [place]) Type of Home: House Home Access: Stairs to enter [employer] of Steps: 2-3   Home Layout: One level     Bathroom Shower/Tub: Occupational psychologist: Standard     Home Equipment: None   Additional Comments: pt reports he may have a sock aid from father's previous hip surgeries      Prior Functioning/Environment Level of Independence: Independent             OT Diagnosis: Generalized weakness;Acute pain   OT Problem List: Decreased strength;Decreased range of motion;Decreased knowledge of use of DME or AE;Decreased knowledge of precautions;Pain   OT Treatment/Interventions: Self-care/ADL training;Energy conservation;DME and/or AE instruction;Therapeutic activities;Patient/family education;Balance training    OT Goals(Current goals can be found in the care plan section) Acute Rehab OT Goals Patient Stated Goal: to go home OT Goal Formulation: With patient Time For Goal Achievement: 08/15/14 Potential to Achieve Goals: Good ADL Goals Pt Will Perform Lower Body Bathing: with supervision;sit to/from stand;with adaptive equipment Pt Will Perform Lower Body Dressing: with supervision;with adaptive equipment;sit to/from stand Pt Will Transfer to Toilet: ambulating;with modified independence Pt Will Perform Toileting - Clothing Manipulation and hygiene: with modified independence;with adaptive equipment;sit to/from stand Pt Will Perform Tub/Shower Transfer: Shower transfer;with modified independence;ambulating  OT Frequency: Min 1X/week    End of Session Equipment Utilized During Treatment: Gait belt;Rolling walker;Back brace  Activity Tolerance: Patient  tolerated treatment well Patient left: in chair;with call bell/phone within reach;with family/visitor present   Time: 0850-0920 OT Time Calculation (min): 30 min Charges:  OT General Charges $OT Visit: 1 Procedure OT Evaluation $Initial OT Evaluation Tier I: 1 Procedure OT Treatments $Self Care/Home Management : 8-22 mins  Juluis Rainier [phone] 08/08/2014, 9:38 AM

## 2014-08-08 NOTE — Clinical Social Work Note (Addendum)
CSW Consult Acknowledged:   CSW acknowledged consult for SNF placement. CSW reviewed chart. Per OT note the pt is min guard for mobility. CSW is awaiting PT's evaluation. CSW will follow up on consult pending PT recommendations.    [place], MSW, [place]

## 2014-08-08 NOTE — Evaluation (Signed)
Physical Therapy Evaluation Patient Details Name: Keith Chan MRN: [id number] DOB: Nov 19, 1961 Today's Date: 08/08/2014   History of Present Illness  Kraig A. Villard is a 53 y.o. male s/p PLIF 2 levels on 08/07/14. PMH of HTN, PONV, and Coronary angioplasty with stent placement (2005).   Clinical Impression  Pt admitted with/for lumbar fusion surgery.  Pt currently limited functionally due to the problems listed below.  (see problems list.)  Pt will benefit from PT to maximize function and safety to be able to get home safely with available assist of family.     Follow Up Recommendations No PT follow up    Equipment Recommendations  None recommended by PT    Recommendations for Other Services       Precautions / Restrictions Precautions Precautions: Back;Fall Precaution Booklet Issued: Yes (comment) Precaution Comments: Educated pt/wife on 3/3 back preacutions and incorporating into ADLs.  Required Braces or Orthoses: Spinal Brace Spinal Brace: Lumbar corset      Mobility  Bed Mobility Overal bed mobility: Needs Assistance Bed Mobility: Rolling;Sidelying to Sit;Sit to Sidelying Rolling: Supervision Sidelying to sit: Supervision     Sit to sidelying: Supervision General bed mobility comments: reinforced technique and gave pointers on improving.  Transfers Overall transfer level: Needs assistance Equipment used: Rolling walker (2 wheeled) Transfers: Sit to/from Stand Sit to Stand: Supervision         General transfer comment: reinforced safe technique  Ambulation/Gait Ambulation/Gait assistance: Supervision Ambulation Distance (Feet): 300 Feet Assistive device: Rolling walker (2 wheeled) Gait Pattern/deviations: Step-through pattern Gait velocity: able to speed up to cue Gait velocity interpretation: Below normal speed for age/gender General Gait Details: steady with RW  Stairs Stairs: Yes Stairs assistance: Min assist Stair Management: No rails Number of  Stairs: 4 General stair comments: guarded due to trying with no rail.  Wife will assist easily  Wheelchair Mobility    Modified Rankin (Stroke Patients Only)       Balance Overall balance assessment: No apparent balance deficits (not formally assessed)                                           Pertinent Vitals/Pain Pain Assessment: 0-10 Pain Score: 5  Pain Location: back Pain Intervention(s): Premedicated before session    [place] expects to be discharged to:: Private residence Living Arrangements: Spouse/significant other;Children Available Help at Discharge: Family;Available 24 hours/day Type of Home: House Home Access: Stairs to enter Entrance Stairs-Rails: None Entrance Stairs-Number of Steps: 2-3 Home Layout: One level Home Equipment: None      Prior Function Level of Independence: Independent               Hand Dominance   Dominant Hand: Right    Extremity/Trunk Assessment   Upper Extremity Assessment: Defer to OT evaluation           Lower Extremity Assessment: Overall WFL for tasks assessed      Cervical / Trunk Assessment: Normal  Communication   Communication: No difficulties  Cognition Arousal/Alertness: Awake/alert Behavior During Therapy: WFL for tasks assessed/performed Overall Cognitive Status: Within Functional Limits for tasks assessed                      General Comments General comments (skin integrity, edema, etc.): Reinforced all back care/prec., log roll/general bed mobility, lifting precautions, bracing issues and progression of  activity.    Exercises        Assessment/Plan    PT Assessment Patient needs continued PT services  PT Diagnosis Acute pain   PT Problem List Decreased mobility;Decreased knowledge of precautions;Decreased knowledge of use of DME;Pain  PT Treatment Interventions DME instruction;Gait training;Functional mobility training;Therapeutic  activities;Patient/family education   PT Goals (Current goals can be found in the Care Plan section) Acute Rehab PT Goals Patient Stated Goal: to go home PT Goal Formulation: With patient Potential to Achieve Goals: Good    Frequency Min 5X/week   Barriers to discharge        Co-evaluation               End of Session   Activity Tolerance: Patient tolerated treatment well Patient left: in chair;with call bell/phone within reach;with family/visitor present Nurse Communication: Mobility status         Time: 1205-1230 PT Time Calculation (min): 25 min   Charges:   PT Evaluation $Initial PT Evaluation Tier I: 1 Procedure PT Treatments $Gait Training: 8-22 mins   PT G Codes:          Cotey Rakes, Tessie Fass 08/08/2014, 6:02 PM 08/08/2014  Donnella Sham, [place] [phone]  (pager)

## 2014-08-09 MED ORDER — POLYETHYLENE GLYCOL 3350 17 G PO PACK
17.0000 g | PACK | Freq: Every day | ORAL | Status: DC
  Administered 2014-08-10: 17 g via ORAL
  Filled 2014-08-09: qty 1

## 2014-08-09 MED ORDER — DIAZEPAM 5 MG PO TABS
5.0000 mg | ORAL_TABLET | Freq: Three times a day (TID) | ORAL | Status: DC | PRN
  Administered 2014-08-09 – 2014-08-10 (×2): 5 mg via ORAL
  Filled 2014-08-09 (×2): qty 1

## 2014-08-09 NOTE — Progress Notes (Signed)
MD called regarding pt's continued muscle spasms. New order received. Pt, family aware.

## 2014-08-09 NOTE — Progress Notes (Signed)
Subjective: Patient reports Doing much better with pain no leg pain is all localized to his back still with abdominal distention but he is passing gas  Objective: Vital signs in last 24 hours: Temp:  [97.7 F (36.5 C)-100 F (37.8 C)] 98.5 F (36.9 C) (09/11 0935) Pulse Rate:  [89-99] 99 (09/11 0935) Resp:  [18-20] 20 (09/11 0935) BP: (115-136)/(70-94) 126/94 mmHg (09/11 0935) SpO2:  [91 %-95 %] 94 % (09/11 0935)  Intake/Output from previous day: 09/10 0701 - 09/11 0700 In: 958 [P.O.:858; IV Piggyback:100] Out: 2576 [Urine:2226; Drains:350] Intake/Output this shift: Total I/O In: 480 [P.O.:480] Out: -   Strength out of 5 wound clean dry intact  Lab Results: No results found for this basename: WBC, HGB, HCT, PLT,  in the last 72 hours BMET No results found for this basename: NA, K, CL, CO2, GLUCOSE, BUN, CREATININE, CALCIUM,  in the last 72 hours  Studies/Results: Dg Lumbar Spine 2-3 Views  08/07/2014   CLINICAL DATA:  PLIF L4-5, L5-S1  EXAM: DG C-ARM 61-120 MIN; LUMBAR SPINE - 2-3 VIEW  FLUOROSCOPY TIME:  1 min 37 seconds  COMPARISON:  None  FINDINGS: Posterior lumbar interbody fusion at L4-5 and L5-S1 with bilateral pedicle screws and intervertebral spacer devices at each level. Posterior tissue retractors are noted.  IMPRESSION: PLIF L4-S1.   Electronically Signed   By: Kathreen Devoid   On: 08/07/2014 12:50   Dg C-arm 61-120 Min  08/07/2014   CLINICAL DATA:  PLIF L4-5, L5-S1  EXAM: DG C-ARM 61-120 MIN; LUMBAR SPINE - 2-3 VIEW  FLUOROSCOPY TIME:  1 min 37 seconds  COMPARISON:  None  FINDINGS: Posterior lumbar interbody fusion at L4-5 and L5-S1 with bilateral pedicle screws and intervertebral spacer devices at each level. Posterior tissue retractors are noted.  IMPRESSION: PLIF L4-S1.   Electronically Signed   By: Kathreen Devoid   On: 08/07/2014 12:50    Assessment/Plan: Progressive mobilization today with physical and occupational therapy we'll start MiraLAX we'll continue to use  suppositories as needed DC his Hemovac  LOS: 2 days     Norvella Loscalzo P 08/09/2014, 11:57 AM

## 2014-08-09 NOTE — Progress Notes (Signed)
OT Cancellation Note  Patient Details Name: SHANTA DORVIL MRN: [id number] DOB: 1961/09/01   Cancelled Treatment:    Reason Eval/Treat Not Completed: Other (comment) OT checked in with pt and pt has no further ADL concerns. Pt ambulated 300 ft yesterday with PT at Supervision level and has ambulated this morning with his wife. Briefly reinforced back precautions and ADL education. Acute OT to sign off.   Juluis Rainier [phone] 08/09/2014, 10:07 AM

## 2014-08-09 NOTE — Progress Notes (Signed)
Physical Therapy Treatment AND DISCHARGE Patient Details Name: Keith Chan MRN: [id number] DOB: 1961/09/19 Today's Date: 08/09/2014    History of Present Illness Keith Chan is a 53 y.o. male s/p PLIF 2 levels on 08/07/14. PMH of HTN, PONV, and Coronary angioplasty with stent placement (2005).     PT Comments    All education completed, pt at or approach mod I with all mobility and has wife available to assist if needed.  No further PT needs.  Sign off at this time.  Follow Up Recommendations  No PT follow up     Equipment Recommendations  None recommended by PT    Recommendations for Other Services       Precautions / Restrictions Precautions Precautions: Back;Fall Precaution Booklet Issued: Yes (comment) Precaution Comments: Educated pt/wife on 3/3 back preacutions and incorporating into ADLs.  Required Braces or Orthoses: Spinal Brace Spinal Brace: Lumbar corset    Mobility  Bed Mobility Overal bed mobility: Modified Independent Bed Mobility: Sidelying to Sit;Sit to Sidelying Rolling: Modified independent (Device/Increase time) Sidelying to sit: Modified independent (Device/Increase time)     Sit to sidelying: Modified independent (Device/Increase time) General bed mobility comments: reinforced technique once more  Transfers Overall transfer level: Needs assistance Equipment used: None Transfers: Sit to/from Stand Sit to Stand: Supervision         General transfer comment: reinforced safe technique  Ambulation/Gait Ambulation/Gait assistance: Supervision Ambulation Distance (Feet): 600 Feet Assistive device: None;Rolling walker (2 wheeled) Gait Pattern/deviations: Step-through pattern Gait velocity: able to speed up to cue Gait velocity interpretation: at or above normal speed for age/gender General Gait Details: steady with/without Assistive device.   Stairs            Wheelchair Mobility    Modified Rankin (Stroke Patients Only)       Balance Overall balance assessment: No apparent balance deficits (not formally assessed)                                  Cognition Arousal/Alertness: Awake/alert Behavior During Therapy: WFL for tasks assessed/performed Overall Cognitive Status: Within Functional Limits for tasks assessed                      Exercises      General Comments General comments (skin integrity, edema, etc.): Reinforced all education.  pt/wife to ask about showering, driving and when can be out and about.      Pertinent Vitals/Pain Pain Assessment: 0-10 Pain Score: 4  Pain Location: bil lower buttocks Pain Descriptors / Indicators: Constant Pain Intervention(s): Premedicated before session;Limited activity within patient's tolerance    Home Living                      Prior Function            PT Goals (current goals can now be found in the care plan section) Acute Rehab PT Goals Patient Stated Goal: to go home PT Goal Formulation: With patient Potential to Achieve Goals: Good Progress towards PT goals: Goals met/education completed, patient discharged from PT    Frequency  Min 5X/week    PT Plan Current plan remains appropriate    Co-evaluation             End of Session Equipment Utilized During Treatment: Back brace Activity Tolerance: Patient tolerated treatment well Patient left: in bed;with call bell/phone within reach;with  family/visitor present     Time: 8144-8185 PT Time Calculation (min): 18 min  Charges:  $Gait Training: 8-22 mins                    G Codes:      Keith Chan, Keith Chan 08/09/2014, 1:10 PM 08/09/2014  Keith Chan, PT [phone]  (pager)

## 2014-08-10 MED ORDER — HYDROMORPHONE HCL 2 MG PO TABS: 2.0000 mg | ORAL_TABLET | ORAL | Status: DC | PRN

## 2014-08-10 MED ORDER — DSS 100 MG PO CAPS: 100.0000 mg | ORAL_CAPSULE | Freq: Two times a day (BID) | ORAL | Status: DC

## 2014-08-10 MED ORDER — DIAZEPAM 5 MG PO TABS: 5.0000 mg | ORAL_TABLET | Freq: Three times a day (TID) | ORAL | Status: DC | PRN

## 2014-08-10 NOTE — Progress Notes (Signed)
Keith Chan is discharged to go home today, noticed pt hasn't had a bowel movement for last tow days. Told him that its better to treat it before he go home and offered enema or other treatments. But the patient said he doesn't want any other treatments, want to go home.  Given him all ordered dose of laxatives including Meilax and some prune juce. Given all d/c education & prescriptions.. No other concerns.

## 2014-08-10 NOTE — Plan of Care (Signed)
Problem: Consults Goal: Diagnosis - Spinal Surgery Outcome: Completed/Met Date Met:  08/10/14 PLIF and microdiscectomy

## 2014-08-10 NOTE — Discharge Summary (Signed)
Physician Discharge Summary  Patient ID: Keith Chan MRN: [id number] DOB/AGE: Feb 07, 1961 53 y.o.  Admit date: 08/07/2014 Discharge date: 08/10/2014  Admission Diagnoses: L4-5 and L5-S1 spinal stenosis, lumbago, lumbar radiculopathy  Discharge Diagnoses: The same Active Problems:   Spinal stenosis of lumbar region   Discharged Condition: good  Hospital Course: Dr. Saintclair Halsted performed an L4-5 and L5-S1 decompression, instrumentation, and fusion on the patient on 08/07/2014.  The patient's postoperative course was unremarkable. On 08/10/2014 the patient requested discharge to home. The patient was given oral and written discharge instructions. All his questions were answered.  Consults: None Significant Diagnostic Studies: None Treatments: L4-5 and L5-S1 decompression, instrumentation, and fusion. Discharge Exam: Blood pressure 129/76, pulse 96, temperature 99.1 F (37.3 C), temperature source Oral, resp. rate 16, height 5\' 2"  (1.575 m), weight 75.297 kg (166 lb), SpO2 92.00%. The patient is alert and pleasant. He looks well. His strength is normal. His dressing is clean and dry.  Disposition: Home  Discharge Instructions   Call MD for:  difficulty breathing, headache or visual disturbances    Complete by:  As directed      Call MD for:  extreme fatigue    Complete by:  As directed      Call MD for:  hives    Complete by:  As directed      Call MD for:  persistant dizziness or light-headedness    Complete by:  As directed      Call MD for:  persistant nausea and vomiting    Complete by:  As directed      Call MD for:  redness, tenderness, or signs of infection (pain, swelling, redness, odor or green/yellow discharge around incision site)    Complete by:  As directed      Call MD for:  severe uncontrolled pain    Complete by:  As directed      Call MD for:  temperature >100.4    Complete by:  As directed      Diet - low sodium heart healthy    Complete by:  As directed      Discharge instructions    Complete by:  As directed   Call [phone] for a followup appointment. Take a stool softener while you are using pain medications.     Driving Restrictions    Complete by:  As directed   Do not drive for 2 weeks.     Increase activity slowly    Complete by:  As directed      Lifting restrictions    Complete by:  As directed   Do not lift more than 5 pounds. No excessive bending or twisting.     May shower / Bathe    Complete by:  As directed   He may shower after the pain she is removed 3 days after surgery. Leave the incision alone.     Remove dressing in 24 hours    Complete by:  As directed             Medication List    STOP taking these medications       ibuprofen 200 MG tablet  Commonly known as:  ADVIL,MOTRIN      TAKE these medications       amLODipine 5 MG tablet  Commonly known as:  NORVASC  Take 5 mg by mouth daily.     aspirin 81 MG chewable tablet  Chew 81 mg by mouth daily.     atenolol  100 MG tablet  Commonly known as:  TENORMIN  Take 100 mg by mouth daily.     atorvastatin 20 MG tablet  Commonly known as:  LIPITOR  Take 20 mg by mouth daily.     clopidogrel 75 MG tablet  Commonly known as:  PLAVIX  Take 75 mg by mouth daily.     diazepam 5 MG tablet  Commonly known as:  VALIUM  Take 1 tablet (5 mg total) by mouth every 8 (eight) hours as needed for anxiety.     DSS 100 MG Caps  Take 100 mg by mouth 2 (two) times daily.     HYDROmorphone 2 MG tablet  Commonly known as:  DILAUDID  Take 1 tablet (2 mg total) by mouth every 4 (four) hours as needed for severe pain.     pantoprazole 40 MG tablet  Commonly known as:  PROTONIX  Take 40 mg by mouth daily.         SignedOphelia Charter 08/10/2014, 8:17 AM

## 2014-11-07 ENCOUNTER — Other Ambulatory Visit: Payer: Self-pay | Admitting: Neurosurgery

## 2014-11-07 DIAGNOSIS — I739 Peripheral vascular disease, unspecified: Secondary | ICD-10-CM

## 2014-11-15 ENCOUNTER — Ambulatory Visit
Admission: RE | Admit: 2014-11-15 | Discharge: 2014-11-15 | Disposition: A | Discharge status: Home / Self Care | Payer: BC Managed Care – PPO | Source: Ambulatory Visit | Attending: Neurosurgery | Admitting: Neurosurgery

## 2014-11-15 DIAGNOSIS — I739 Peripheral vascular disease, unspecified: Secondary | ICD-10-CM

## 2014-11-15 MED ORDER — IOHEXOL 350 MG/ML SOLN
80.0000 mL | Freq: Once | INTRAVENOUS | Status: AC | PRN
  Administered 2014-11-15: 80 mL via INTRAVENOUS

## 2015-03-07 ENCOUNTER — Ambulatory Visit
Admit: 2015-03-07 | Disposition: A | Discharge status: Home / Self Care | Payer: Self-pay | Attending: Gastroenterology | Admitting: Gastroenterology

## 2015-09-16 IMAGING — CR DG CHEST 2V
2 series · 2 of 2 positions shown · non-contrast
Comparison: CT abdomen pelvis of 05/20/2014

CLINICAL DATA: Preop for lumbar spine surgery

EXAM:
CHEST  2 VIEW

[w chest pa]
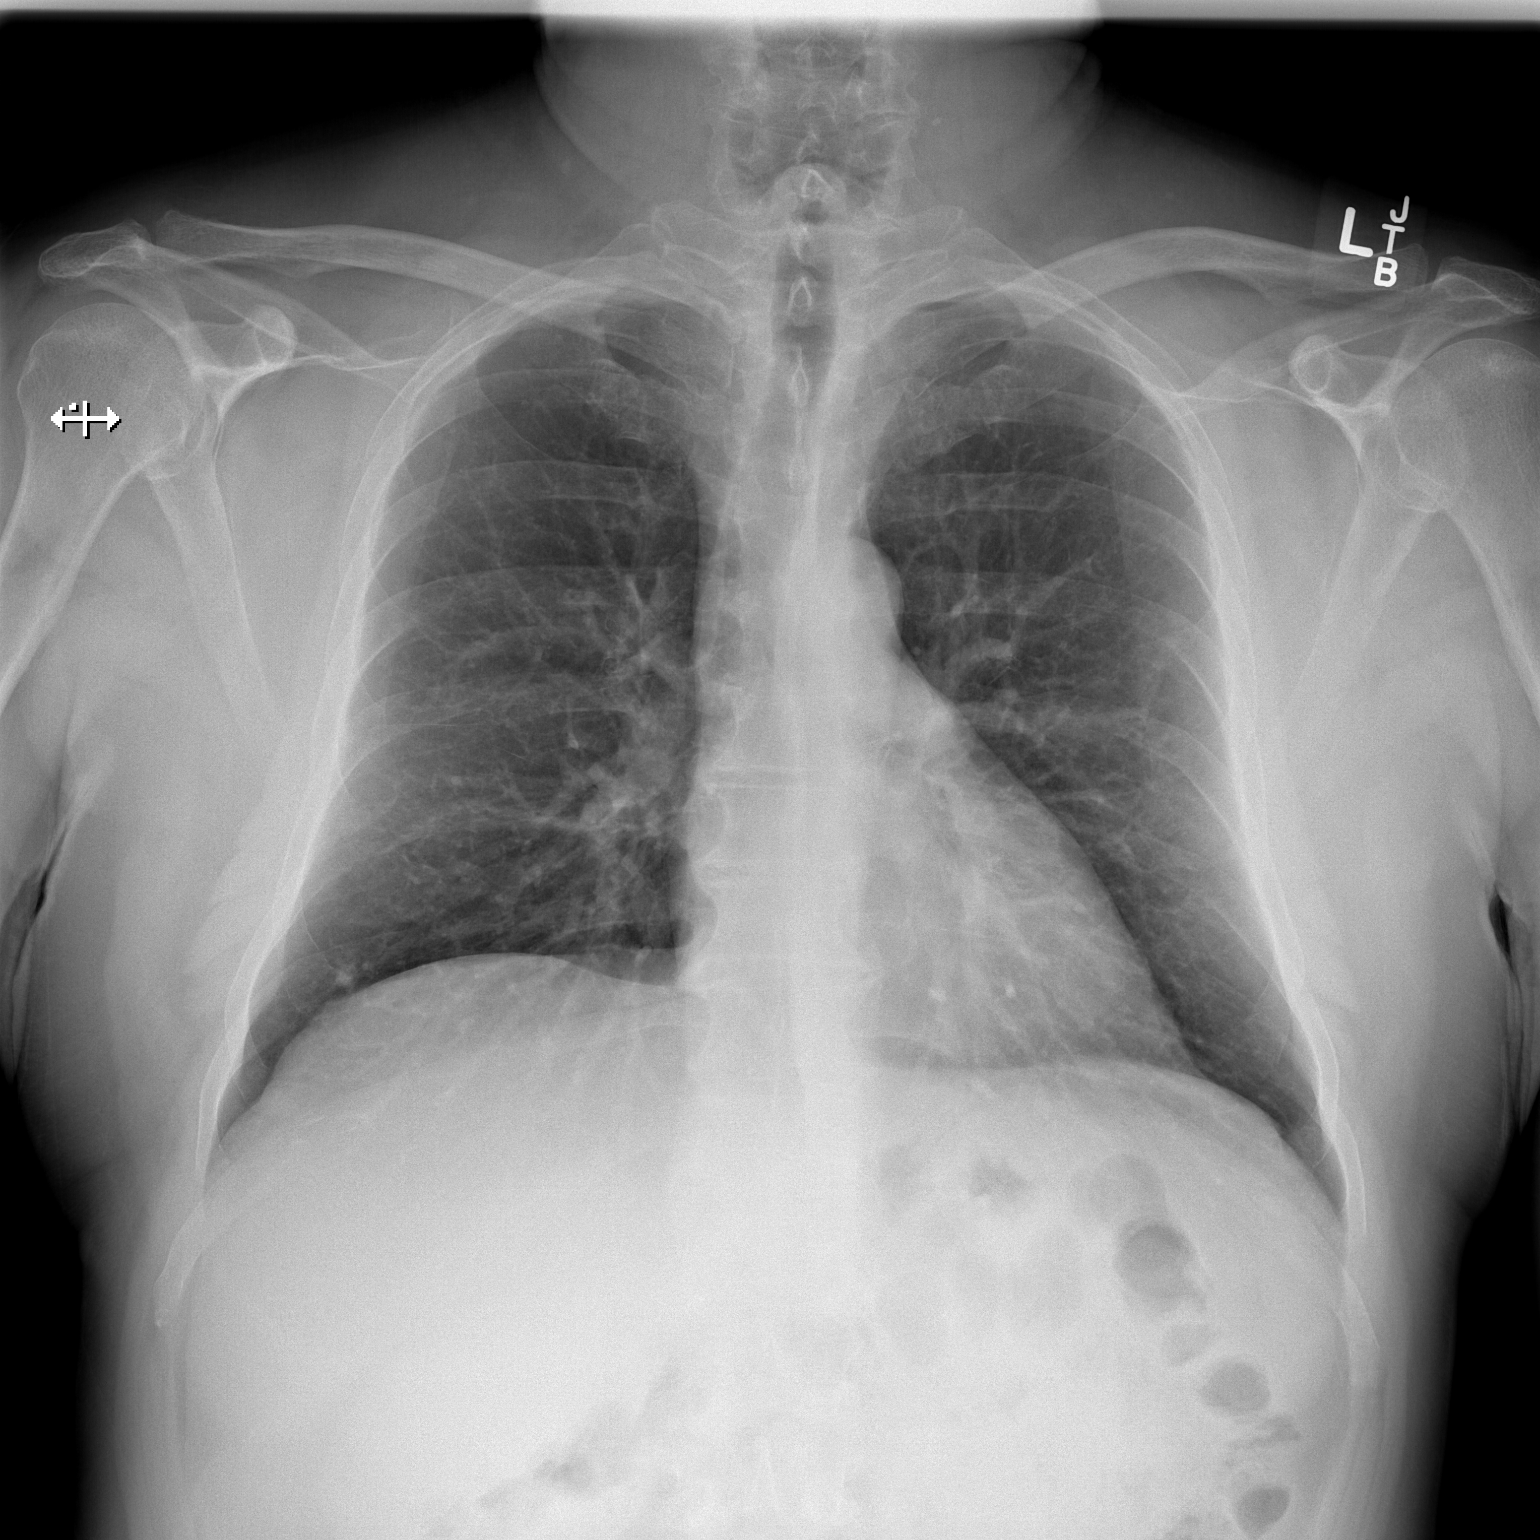

[w chest lat]
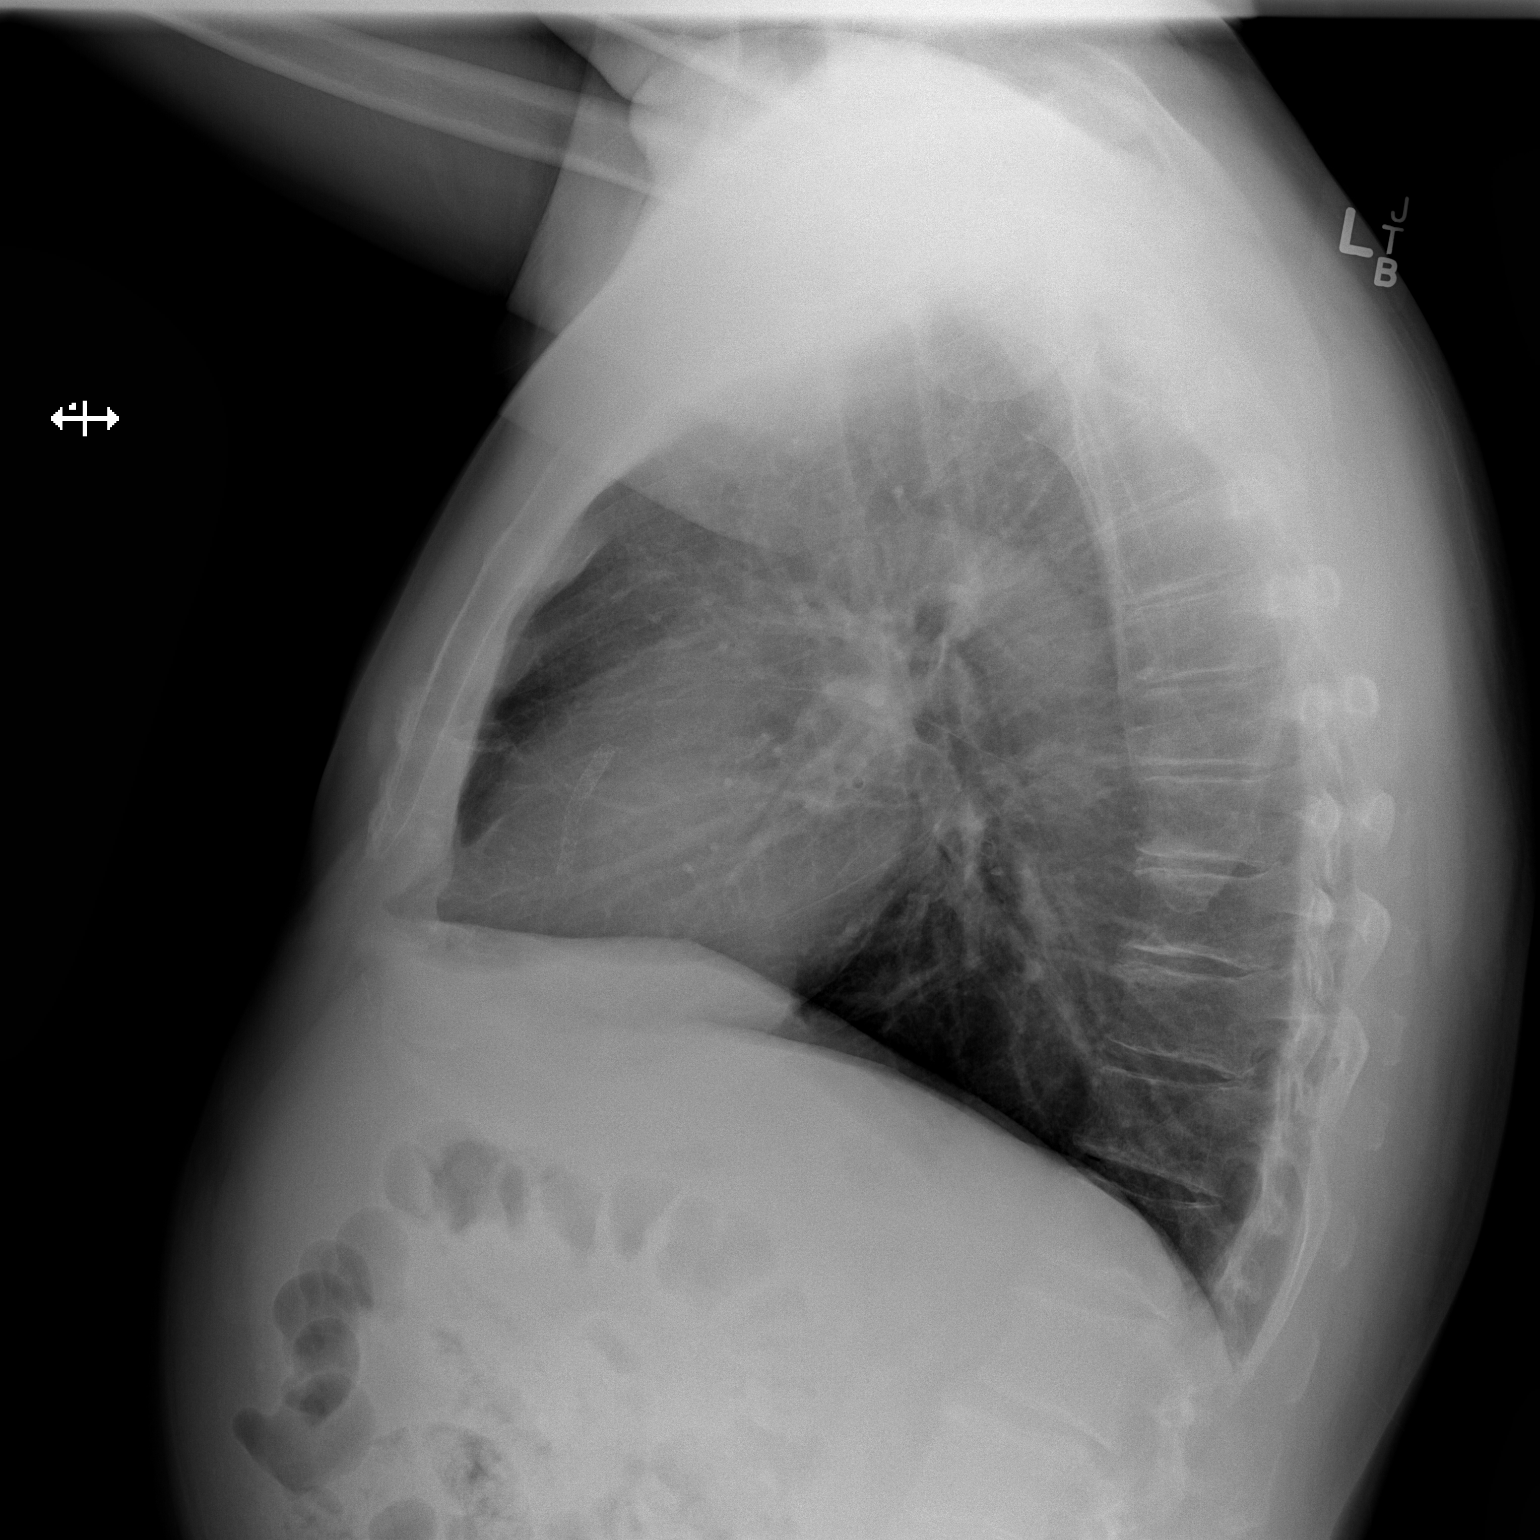

[2 of 2 positions shown; findings below may reference images not displayed]

FINDINGS: A small slightly dense nodular in the right lung base represents a
calcified granuloma when compared to the prior CT of the abdomen
pelvis including the lung bases. No active infiltrate or effusion is
seen. Mediastinal and hilar contours are within normal limits and
the heart is normal in size. No bony abnormality is seen.
IMPRESSION: No active lung disease. Small calcified granuloma at the right lung
base.

## 2017-04-21 ENCOUNTER — Emergency Department: Payer: BLUE CROSS/BLUE SHIELD

## 2017-04-21 ENCOUNTER — Observation Stay
Admission: EM | Admit: 2017-04-21 | Discharge: 2017-04-22 | Disposition: A | Discharge status: Home / Self Care | Payer: BLUE CROSS/BLUE SHIELD | Attending: Internal Medicine | Admitting: Internal Medicine

## 2017-04-21 DIAGNOSIS — Z8249 Family history of ischemic heart disease and other diseases of the circulatory system: Secondary | ICD-10-CM | POA: Insufficient documentation

## 2017-04-21 DIAGNOSIS — K449 Diaphragmatic hernia without obstruction or gangrene: Secondary | ICD-10-CM | POA: Insufficient documentation

## 2017-04-21 DIAGNOSIS — F419 Anxiety disorder, unspecified: Secondary | ICD-10-CM | POA: Diagnosis not present

## 2017-04-21 DIAGNOSIS — E7800 Pure hypercholesterolemia, unspecified: Secondary | ICD-10-CM | POA: Diagnosis not present

## 2017-04-21 DIAGNOSIS — F1290 Cannabis use, unspecified, uncomplicated: Secondary | ICD-10-CM | POA: Insufficient documentation

## 2017-04-21 DIAGNOSIS — I25110 Atherosclerotic heart disease of native coronary artery with unstable angina pectoris: Secondary | ICD-10-CM | POA: Diagnosis not present

## 2017-04-21 DIAGNOSIS — Y831 Surgical operation with implant of artificial internal device as the cause of abnormal reaction of the patient, or of later complication, without mention of misadventure at the time of the procedure: Secondary | ICD-10-CM | POA: Insufficient documentation

## 2017-04-21 DIAGNOSIS — Z87891 Personal history of nicotine dependence: Secondary | ICD-10-CM | POA: Diagnosis not present

## 2017-04-21 DIAGNOSIS — R911 Solitary pulmonary nodule: Secondary | ICD-10-CM | POA: Insufficient documentation

## 2017-04-21 DIAGNOSIS — R079 Chest pain, unspecified: Secondary | ICD-10-CM

## 2017-04-21 DIAGNOSIS — K219 Gastro-esophageal reflux disease without esophagitis: Secondary | ICD-10-CM | POA: Diagnosis not present

## 2017-04-21 DIAGNOSIS — Z7982 Long term (current) use of aspirin: Secondary | ICD-10-CM | POA: Insufficient documentation

## 2017-04-21 DIAGNOSIS — M1990 Unspecified osteoarthritis, unspecified site: Secondary | ICD-10-CM | POA: Insufficient documentation

## 2017-04-21 DIAGNOSIS — E876 Hypokalemia: Secondary | ICD-10-CM | POA: Diagnosis not present

## 2017-04-21 DIAGNOSIS — I10 Essential (primary) hypertension: Secondary | ICD-10-CM | POA: Diagnosis not present

## 2017-04-21 DIAGNOSIS — Z7902 Long term (current) use of antithrombotics/antiplatelets: Secondary | ICD-10-CM | POA: Insufficient documentation

## 2017-04-21 DIAGNOSIS — T82855A Stenosis of coronary artery stent, initial encounter: Secondary | ICD-10-CM | POA: Diagnosis not present

## 2017-04-21 LAB — CBC
HCT: 43.2 % (ref 40.0–52.0)
Hemoglobin: 14.6 g/dL (ref 13.0–18.0)
MCH: 30 pg (ref 26.0–34.0)
MCHC: 33.8 g/dL (ref 32.0–36.0)
MCV: 88.8 fL (ref 80.0–100.0)
PLATELETS: 238 10*3/uL (ref 150–440)
RBC: 4.87 MIL/uL (ref 4.40–5.90)
RDW: 12.8 % (ref 11.5–14.5)
WBC: 8.5 10*3/uL (ref 3.8–10.6)

## 2017-04-21 LAB — COMPREHENSIVE METABOLIC PANEL
ALT: 28 U/L (ref 17–63)
AST: 29 U/L (ref 15–41)
Albumin: 4.3 g/dL (ref 3.5–5.0)
Alkaline Phosphatase: 80 U/L (ref 38–126)
Anion gap: 7 (ref 5–15)
BUN: 15 mg/dL (ref 6–20)
CHLORIDE: 107 mmol/L (ref 101–111)
CO2: 23 mmol/L (ref 22–32)
CREATININE: 0.65 mg/dL (ref 0.61–1.24)
Calcium: 9.7 mg/dL (ref 8.9–10.3)
GFR calc Af Amer: >60 mL/min (ref >60)
GFR calc non Af Amer: >60 mL/min (ref >60)
Glucose, Bld: 169 mg/dL — ABNORMAL HIGH (ref 65–99)
POTASSIUM: 2.9 mmol/L — AB (ref 3.5–5.1)
SODIUM: 137 mmol/L (ref 135–145)
Total Bilirubin: 0.4 mg/dL (ref 0.3–1.2)
Total Protein: 7.4 g/dL (ref 6.5–8.1)

## 2017-04-21 LAB — TROPONIN I: Troponin I: 0.05 ng/mL (ref <0.03)

## 2017-04-21 NOTE — ED Notes (Signed)
Patient returned from XR. 

## 2017-04-21 NOTE — ED Triage Notes (Signed)
Pt in with co chest pain that woke him up, states has hx of multiple blockages in the past. No sob, nausea, or diaphoresis, denies any recent illness.

## 2017-04-22 ENCOUNTER — Encounter
Admission: EM | Disposition: A | Discharge status: Home / Self Care | Payer: Self-pay | Source: Home / Self Care | Attending: Emergency Medicine

## 2017-04-22 ENCOUNTER — Encounter: Payer: Self-pay | Admitting: Internal Medicine

## 2017-04-22 ENCOUNTER — Observation Stay: Admit: 2017-04-22 | Payer: BLUE CROSS/BLUE SHIELD

## 2017-04-22 DIAGNOSIS — R079 Chest pain, unspecified: Secondary | ICD-10-CM | POA: Diagnosis present

## 2017-04-22 HISTORY — PX: LEFT HEART CATH AND CORONARY ANGIOGRAPHY: CATH118249

## 2017-04-22 LAB — LIPID PANEL
CHOL/HDL RATIO: 3.7 ratio
Cholesterol: 155 mg/dL (ref 0–200)
HDL: 42 mg/dL (ref >40)
LDL CALC: 74 mg/dL (ref 0–99)
TRIGLYCERIDES: 193 mg/dL — AB (ref <150)
VLDL: 39 mg/dL (ref 0–40)

## 2017-04-22 LAB — TROPONIN I
TROPONIN I: 0.06 ng/mL — AB (ref <0.03)
TROPONIN I: 0.06 ng/mL — AB (ref <0.03)
Troponin I: 0.05 ng/mL (ref <0.03)
Troponin I: 0.04 ng/mL (ref <0.03)

## 2017-04-22 LAB — CBC
HCT: 41.9 % (ref 40.0–52.0)
HEMOGLOBIN: 14.2 g/dL (ref 13.0–18.0)
MCH: 30.3 pg (ref 26.0–34.0)
MCHC: 33.8 g/dL (ref 32.0–36.0)
MCV: 89.7 fL (ref 80.0–100.0)
PLATELETS: 187 10*3/uL (ref 150–440)
RBC: 4.67 MIL/uL (ref 4.40–5.90)
RDW: 13.2 % (ref 11.5–14.5)
WBC: 7.4 10*3/uL (ref 3.8–10.6)

## 2017-04-22 LAB — BASIC METABOLIC PANEL
Anion gap: 6 (ref 5–15)
BUN: 11 mg/dL (ref 6–20)
CHLORIDE: 105 mmol/L (ref 101–111)
CO2: 27 mmol/L (ref 22–32)
CREATININE: 0.53 mg/dL — AB (ref 0.61–1.24)
Calcium: 8.8 mg/dL — ABNORMAL LOW (ref 8.9–10.3)
GFR calc non Af Amer: >60 mL/min (ref >60)
Glucose, Bld: 94 mg/dL (ref 65–99)
POTASSIUM: 3.8 mmol/L (ref 3.5–5.1)
Sodium: 138 mmol/L (ref 135–145)

## 2017-04-22 SURGERY — LEFT HEART CATH AND CORONARY ANGIOGRAPHY
Anesthesia: Moderate Sedation

## 2017-04-22 MED ORDER — ENOXAPARIN SODIUM 40 MG/0.4ML ~~LOC~~ SOLN
40.0000 mg | SUBCUTANEOUS | Status: DC
  Administered 2017-04-22: 40 mg via SUBCUTANEOUS
  Filled 2017-04-22: qty 0.4

## 2017-04-22 MED ORDER — SODIUM CHLORIDE 0.9 % IV SOLN: 250.0000 mL | INTRAVENOUS | Status: DC | PRN

## 2017-04-22 MED ORDER — ASPIRIN EC 81 MG PO TBEC
81.0000 mg | DELAYED_RELEASE_TABLET | Freq: Every day | ORAL | Status: DC
Start: 2017-04-23 — End: 2017-04-22

## 2017-04-22 MED ORDER — NITROGLYCERIN 0.4 MG SL SUBL: 0.4000 mg | SUBLINGUAL_TABLET | SUBLINGUAL | Status: DC | PRN

## 2017-04-22 MED ORDER — PANTOPRAZOLE SODIUM 40 MG PO TBEC
40.0000 mg | DELAYED_RELEASE_TABLET | Freq: Every day | ORAL | Status: DC
  Administered 2017-04-22: 40 mg via ORAL
  Filled 2017-04-22: qty 1

## 2017-04-22 MED ORDER — NITROGLYCERIN 0.4 MG SL SUBL: 0.4000 mg | SUBLINGUAL_TABLET | SUBLINGUAL | 12 refills | Status: AC | PRN

## 2017-04-22 MED ORDER — SODIUM CHLORIDE 0.9 % WEIGHT BASED INFUSION: 1.0000 mL/kg/h | INTRAVENOUS | Status: DC

## 2017-04-22 MED ORDER — ASPIRIN 81 MG PO CHEW: 81.0000 mg | CHEWABLE_TABLET | ORAL | Status: DC

## 2017-04-22 MED ORDER — SODIUM CHLORIDE 0.9% FLUSH: 3.0000 mL | INTRAVENOUS | Status: DC | PRN

## 2017-04-22 MED ORDER — SODIUM CHLORIDE 0.9 % WEIGHT BASED INFUSION: 3.0000 mL/kg/h | INTRAVENOUS | Status: DC

## 2017-04-22 MED ORDER — FENTANYL CITRATE (PF) 100 MCG/2ML IJ SOLN
INTRAMUSCULAR | Status: AC
  Filled 2017-04-22: qty 2

## 2017-04-22 MED ORDER — MIDAZOLAM HCL 2 MG/2ML IJ SOLN
INTRAMUSCULAR | Status: DC | PRN
  Administered 2017-04-22: 1 mg via INTRAVENOUS

## 2017-04-22 MED ORDER — FENTANYL CITRATE (PF) 100 MCG/2ML IJ SOLN
INTRAMUSCULAR | Status: DC | PRN
  Administered 2017-04-22: 50 ug via INTRAVENOUS

## 2017-04-22 MED ORDER — SODIUM CHLORIDE 0.9% FLUSH: 3.0000 mL | Freq: Two times a day (BID) | INTRAVENOUS | Status: DC

## 2017-04-22 MED ORDER — ATORVASTATIN CALCIUM 20 MG PO TABS
20.0000 mg | ORAL_TABLET | Freq: Every day | ORAL | Status: DC
  Administered 2017-04-22: 20 mg via ORAL
  Filled 2017-04-22: qty 1

## 2017-04-22 MED ORDER — ONDANSETRON HCL 4 MG/2ML IJ SOLN: 4.0000 mg | Freq: Four times a day (QID) | INTRAMUSCULAR | Status: DC | PRN

## 2017-04-22 MED ORDER — CLOPIDOGREL BISULFATE 75 MG PO TABS
75.0000 mg | ORAL_TABLET | Freq: Every day | ORAL | Status: DC
  Administered 2017-04-22: 75 mg via ORAL
  Filled 2017-04-22: qty 1

## 2017-04-22 MED ORDER — POTASSIUM CHLORIDE CRYS ER 20 MEQ PO TBCR
40.0000 meq | EXTENDED_RELEASE_TABLET | Freq: Once | ORAL | Status: AC
  Administered 2017-04-22: 40 meq via ORAL
  Filled 2017-04-22: qty 2

## 2017-04-22 MED ORDER — ASPIRIN 81 MG PO CHEW
162.0000 mg | CHEWABLE_TABLET | Freq: Once | ORAL | Status: AC
  Administered 2017-04-22: 162 mg via ORAL
  Filled 2017-04-22: qty 2

## 2017-04-22 MED ORDER — SODIUM CHLORIDE 0.9 % IV SOLN
INTRAVENOUS | Status: DC
  Administered 2017-04-22 (×2): via INTRAVENOUS

## 2017-04-22 MED ORDER — ASPIRIN 81 MG PO CHEW
81.0000 mg | CHEWABLE_TABLET | Freq: Every day | ORAL | Status: DC
  Administered 2017-04-22: 81 mg via ORAL
  Filled 2017-04-22: qty 1

## 2017-04-22 MED ORDER — MIDAZOLAM HCL 2 MG/2ML IJ SOLN
INTRAMUSCULAR | Status: AC
  Filled 2017-04-22: qty 2

## 2017-04-22 MED ORDER — ASPIRIN 300 MG RE SUPP: 300.0000 mg | RECTAL | Status: AC

## 2017-04-22 MED ORDER — ATENOLOL 25 MG PO TABS
100.0000 mg | ORAL_TABLET | Freq: Every day | ORAL | Status: DC
  Administered 2017-04-22: 100 mg via ORAL
  Filled 2017-04-22: qty 4

## 2017-04-22 MED ORDER — IOPAMIDOL (ISOVUE-300) INJECTION 61%
INTRAVENOUS | Status: DC | PRN
  Administered 2017-04-22: 110 mL via INTRA_ARTERIAL

## 2017-04-22 MED ORDER — ASPIRIN 81 MG PO CHEW
324.0000 mg | CHEWABLE_TABLET | ORAL | Status: AC
  Administered 2017-04-22: 324 mg via ORAL
  Filled 2017-04-22: qty 4

## 2017-04-22 SURGICAL SUPPLY — 9 items
CATH 5FR JR4 DIAGNOSTIC (CATHETERS) ×3 IMPLANT
CATH INFINITI 5FR ANG PIGTAIL (CATHETERS) ×3 IMPLANT
CATH INFINITI 5FR JL4 (CATHETERS) ×3 IMPLANT
DEVICE CLOSURE MYNXGRIP 5F (Vascular Products) ×3 IMPLANT
KIT MANI 3VAL PERCEP (MISCELLANEOUS) ×3 IMPLANT
NEEDLE PERC 18GX7CM (NEEDLE) ×3 IMPLANT
PACK CARDIAC CATH (CUSTOM PROCEDURE TRAY) ×3 IMPLANT
SHEATH AVANTI 5FR X 11CM (SHEATH) ×3 IMPLANT
WIRE EMERALD 3MM-J .035X150CM (WIRE) ×3 IMPLANT

## 2017-04-22 NOTE — Consult Note (Signed)
[facility] Cardiology  CARDIOLOGY CONSULT NOTE  Patient ID: Keith Chan MRN: [id number] DOB/AGE: 57-Sep-1962 57 y.o.  Admit date: 04/21/2017 Referring Physician patel Primary Physician [facility] Primary Cardiologist Davina Howlett Reason for Consultation Chest pain  HPI: 56 year old gentleman referred for evaluation chest pain. The patient has known coronary artery disease, status post coronary stent right coronary artery 08/2004. The patient had been doing well until earlier this week when he first noticed upper chest discomfort while mowing the yard, which radiated into his neck and both shoulders. The patient went to bed last evening, awoke with current chest pain, more left sided, with radiation to his left arm. He presented to [facility] emergency room where ECG was nondiagnostic. Admission labs were notable for mildly elevated troponin of 0.05.  Review of systems complete and found to be negative unless listed above     Past Medical History:  Diagnosis Date  . Anginal pain ([place])   . Anxiety   . Arthritis    "back; hands/wrists" (08/07/2014)  . Complication of anesthesia   . Family history of anesthesia complication    Mother- N/V  . GERD (gastroesophageal reflux disease)   . H/O hiatal hernia   . High cholesterol   . Hypertension   . PONV (postoperative nausea and vomiting)     Past Surgical History:  Procedure Laterality Date  . CARDIAC CATHETERIZATION  2007   "looked around; qthing was fine"  . COLONOSCOPY W/ POLYPECTOMY    . CORONARY ANGIOPLASTY WITH STENT PLACEMENT  10/19/04   MId- Prox RCA  @ [place]  . POSTERIOR LUMBAR FUSION  08/07/2014   L5-S1    Prescriptions Prior to Admission  Medication Sig Dispense Refill Last Dose  . amLODipine (NORVASC) 10 MG tablet Take 1 tablet by mouth daily.  3   . aspirin 81 MG chewable tablet Chew 81 mg by mouth daily.   5 days  . atenolol (TENORMIN) 100 MG tablet Take 100 mg by mouth daily.   08/07/2014 at Unknown time  . atorvastatin (LIPITOR) 20 MG  tablet Take 20 mg by mouth daily.   08/07/2014 at Unknown time  . clopidogrel (PLAVIX) 75 MG tablet Take 75 mg by mouth daily.   5 days  . hydrochlorothiazide (HYDRODIURIL) 25 MG tablet Take 1 tablet by mouth daily.  3   . pantoprazole (PROTONIX) 40 MG tablet Take 40 mg by mouth daily.   08/07/2014 at Unknown time   Social History   Social History  . Marital status: Married    Spouse name: N/A  . Number of children: N/A  . Years of education: N/A   Occupational History  . works at [employer]  . Smoking status: Former Smoker    Packs/day: 3.00    Years: 6.00    Types: Cigarettes  . Smokeless tobacco: Never Used     Comment: "quit smoking cigarettes in 1982"  . Alcohol use 16.8 oz/week    28 Cans of beer per week     Comment: 08/07/2014 "2, 25oz beers/day"  . Drug use: Yes    Types: Marijuana     Comment: "last drug use was 1979"  . Sexual activity: Yes   Other Topics Concern  . Not on file   Social History Narrative  . No narrative on file    Family History  Problem Relation Age of Onset  . CAD Mother   . Hyperlipidemia Mother   . CAD Father   . Hyperlipidemia Father  Review of systems complete and found to be negative unless listed above      PHYSICAL EXAM  General: Well developed, well nourished, in no acute distress HEENT:  Normocephalic and atramatic Neck:  No JVD.  Lungs: Clear bilaterally to auscultation and percussion. Heart: HRRR . Normal S1 and S2 without gallops or murmurs.  Abdomen: Bowel sounds are positive, abdomen soft and non-tender  Msk:  Back normal, normal gait. Normal strength and tone for age. Extremities: No clubbing, cyanosis or edema.   Neuro: Alert and oriented X 3. Psych:  Good affect, responds appropriately  Labs:   Lab Results  Component Value Date   WBC 8.5 04/21/2017   HGB 14.6 04/21/2017   HCT 43.2 04/21/2017   MCV 88.8 04/21/2017   PLT 238 04/21/2017    Recent Labs Lab 04/21/17 2235   NA 137  K 2.9*  CL 107  CO2 23  BUN 15  CREATININE 0.65  CALCIUM 9.7  PROT 7.4  BILITOT 0.4  ALKPHOS 80  ALT 28  AST 29  GLUCOSE 169*   Lab Results  Component Value Date   TROPONINI 0.06 ([place]) 04/22/2017   No results found for: CHOL No results found for: HDL No results found for: LDLCALC No results found for: TRIG No results found for: CHOLHDL No results found for: LDLDIRECT    Radiology: Dg Chest 2 View  Result Date: 04/21/2017 CLINICAL DATA:  Chest pain EXAM: CHEST  2 VIEW COMPARISON:  07/30/2014 FINDINGS: Stable calcified lung nodule right base. No acute infiltrate or effusion. Normal cardiomediastinal silhouette. No pneumothorax. Mild degenerative changes of the spine. IMPRESSION: No active cardiopulmonary disease. Electronically Signed   By: Donavan Foil M.D.   On: 04/21/2017 22:55    EKG: Normal sinus rhythm  ASSESSMENT AND PLAN:   1. New onset chest pain with exertion and at rest, with borderline elevated troponin, consistent with unstable angina versus non-ST elevation myocardial infarction  Recommendations  1. Cardiac catheterization with selective coronary arteriography. We discussed risks, benefits and alternatives to cardiac catheterization, and possible percutaneous coronary intervention, and signed informed consent was obtained.  Signed: Isaias Cowman MD,PhD, [facility] 04/22/2017, 8:05 AM

## 2017-04-22 NOTE — Plan of Care (Signed)
Problem: Health Behavior/Discharge Planning: Goal: Ability to manage health-related needs will improve Outcome: Completed/Met Date Met: 04/22/17 Discharge instructions regarding cath site care, medications, follow appointments and care.

## 2017-04-22 NOTE — Progress Notes (Signed)
Discharged to home with his wife.  Detailed instructions regarding cath site care.  He has to make his follow up appointments as the offices are closed.  He is aware of this.  Cath site is without evidence of bleeding and surround tissues is soft.

## 2017-04-22 NOTE — Progress Notes (Signed)
Patient arrived to 2A Room 260. Patient denies pain and all questions answered. Patient oriented to unit and Fall Safety Plan signed. Skin assessment completed with Keith Chan and skin intact. A&Ox4, VSS, and NSR on verified tele-box #[id number]. Wife at bedside. Nursing staff will continue to monitor for any changes in patient status. Earleen Reaper, RN

## 2017-04-22 NOTE — Progress Notes (Signed)
[place] at [facility]                                                                                                                                                                                  Patient Demographics   Keith Chan, is a 56 y.o. male, DOB - 05-16-1961, IOE:[id number]  Admit date - 04/21/2017   Admitting Physician Saundra Shelling, MD  Outpatient Primary MD for the patient is Madelyn Brunner, MD   LOS - 0  Subjective: Patient admitted with chest pain no further chest pain.    Review of Systems:   CONSTITUTIONAL: No documented fever. No fatigue, weakness. No weight gain, no weight loss.  EYES: No blurry or double vision.  ENT: No tinnitus. No postnasal drip. No redness of the oropharynx.  RESPIRATORY: No cough, no wheeze, no hemoptysis. No dyspnea.  CARDIOVASCULAR: No chest pain. No orthopnea. No palpitations. No syncope.  GASTROINTESTINAL: No nausea, no vomiting or diarrhea. No abdominal pain. No melena or hematochezia.  GENITOURINARY: No dysuria or hematuria.  ENDOCRINE: No polyuria or nocturia. No heat or cold intolerance.  HEMATOLOGY: No anemia. No bruising. No bleeding.  INTEGUMENTARY: No rashes. No lesions.  MUSCULOSKELETAL: No arthritis. No swelling. No gout.  NEUROLOGIC: No numbness, tingling, or ataxia. No seizure-type activity.  PSYCHIATRIC: No anxiety. No insomnia. No ADD.    Vitals:   Vitals:   04/22/17 0250 04/22/17 0745 04/22/17 1320 04/22/17 1323  BP: (!) 147/100 137/79 (!) 152/97   Pulse: 92 78 72   Resp:  16 17   Temp: 98.4 F (36.9 C) 98.1 F (36.7 C)  98.9 F (37.2 C)  TempSrc: Oral Oral    SpO2: 97% 97% 96%   Weight: 167 lb 8 oz (76 kg)     Height:        Wt Readings from Last 3 Encounters:  04/22/17 167 lb 8 oz (76 kg)  08/07/14 166 lb (75.3 kg)  07/30/14 166 lb 0.1 oz (75.3 kg)     Intake/Output Summary (Last 24 hours) at 04/22/17 1403 Last data filed at 04/22/17 1047  Gross per 24 hour   Intake            237.5 ml  Output              500 ml  Net           -262.5 ml    Physical Exam:   GENERAL: Pleasant-appearing in no apparent distress.  HEAD, EYES, EARS, NOSE AND THROAT: Atraumatic, normocephalic. Extraocular muscles are intact. Pupils equal and reactive to light. Sclerae anicteric. No conjunctival injection. No oro-pharyngeal erythema.  NECK: Supple. There  is no jugular venous distention. No bruits, no lymphadenopathy, no thyromegaly.  HEART: Regular rate and rhythm,. No murmurs, no rubs, no clicks.  LUNGS: Clear to auscultation bilaterally. No rales or rhonchi. No wheezes.  ABDOMEN: Soft, flat, nontender, nondistended. Has good bowel sounds. No hepatosplenomegaly appreciated.  EXTREMITIES: No evidence of any cyanosis, clubbing, or peripheral edema.  +2 pedal and radial pulses bilaterally.  NEUROLOGIC: The patient is alert, awake, and oriented x3 with no focal motor or sensory deficits appreciated bilaterally.  SKIN: Moist and warm with no rashes appreciated.  Psych: Not anxious, depressed LN: No inguinal LN enlargement    Antibiotics   Anti-infectives    None      Medications   Scheduled Meds: . [MAR Hold] aspirin  81 mg Oral Daily  . [START ON 04/23/2017] aspirin  81 mg Oral Pre-Cath  . [MAR Hold] atenolol  100 mg Oral Daily  . [MAR Hold] atorvastatin  20 mg Oral Daily  . [MAR Hold] clopidogrel  75 mg Oral Daily  . [MAR Hold] enoxaparin (LOVENOX) injection  40 mg Subcutaneous Q24H  . [MAR Hold] pantoprazole  40 mg Oral Daily   Continuous Infusions: . sodium chloride 75 mL/hr at 04/22/17 0259   PRN Meds:.fentaNYL, [MAR Hold] nitroGLYCERIN, [MAR Hold] ondansetron (ZOFRAN) IV   Data Review:   Micro Results No results found for this or any previous visit (from the past 240 hour(s)).  Radiology Reports Dg Chest 2 View  Result Date: 04/21/2017 CLINICAL DATA:  Chest pain EXAM: CHEST  2 VIEW COMPARISON:  07/30/2014 FINDINGS: Stable calcified lung  nodule right base. No acute infiltrate or effusion. Normal cardiomediastinal silhouette. No pneumothorax. Mild degenerative changes of the spine. IMPRESSION: No active cardiopulmonary disease. Electronically Signed   By: Donavan Foil M.D.   On: 04/21/2017 22:55     CBC  Recent Labs Lab 04/21/17 2235 04/22/17 0851  WBC 8.5 7.4  HGB 14.6 14.2  HCT 43.2 41.9  PLT 238 187  MCV 88.8 89.7  MCH 30.0 30.3  MCHC 33.8 33.8  RDW 12.8 13.2    Chemistries   Recent Labs Lab 04/21/17 2235 04/22/17 0851  NA 137 138  K 2.9* 3.8  CL 107 105  CO2 23 27  GLUCOSE 169* 94  BUN 15 11  CREATININE 0.65 0.53*  CALCIUM 9.7 8.8*  AST 29  --   ALT 28  --   ALKPHOS 80  --   BILITOT 0.4  --    ------------------------------------------------------------------------------------------------------------------ estimated creatinine clearance is 92.9 mL/min (A) (by C-G formula based on SCr of 0.53 mg/dL (L)). ------------------------------------------------------------------------------------------------------------------ No results for input(s): HGBA1C in the last 72 hours. ------------------------------------------------------------------------------------------------------------------  Recent Labs  04/22/17 0851  CHOL 155  HDL 42  LDLCALC 74  TRIG 193*  CHOLHDL 3.7   ------------------------------------------------------------------------------------------------------------------ No results for input(s): TSH, T4TOTAL, T3FREE, THYROIDAB in the last 72 hours.  Invalid input(s): FREET3 ------------------------------------------------------------------------------------------------------------------ No results for input(s): VITAMINB12, FOLATE, FERRITIN, TIBC, IRON, RETICCTPCT in the last 72 hours.  Coagulation profile No results for input(s): INR, PROTIME in the last 168 hours.  No results for input(s): DDIMER in the last 72 hours.  Cardiac Enzymes  Recent Labs Lab 04/22/17 0217  04/22/17 0302 04/22/17 0851  TROPONINI 0.06* 0.06* 0.05*   ------------------------------------------------------------------------------------------------------------------ Invalid input(s): POCBNP    Assessment & Plan   56 year old male patient with history of coronary artery disease, cardiac stent, GERD, hyperlipidemia, hypertension presented to the emergency room with chest pain.  1. Unstable angina plan for cardiac  catheterization today further disposition and therapy per cardiac catheterization Continue aspirin and Plavix 2. Hypokalemia replace 3. Coronary artery disease continue aspirin and Plavix and beta blocker 4. Hypertension continue therapy with atenolol and HCTZ 5. Hyperlipidemia continue therapy with Lipitor     Code Status Orders        Start     Ordered   04/22/17 0248  Full code  Continuous     04/22/17 0247    Code Status History    Date Active Date Inactive Code Status Order ID Comments User Context   08/07/2014  3:37 PM 08/10/2014  4:03 PM Full Code [id number]  Elaina Hoops, MD Inpatient           Consults cardiology DVT Prophylaxis  Lovenox    Lab Results  Component Value Date   PLT 187 04/22/2017     Time Spent in minutes   42min Greater than 50% of time spent in care coordination and counseling patient regarding the condition and plan of care.   Dustin Flock M.D on 04/22/2017 at 2:03 PM  Between 7am to 6pm - Pager - [phone]  After 6pm go to [email or web address] - password [email or web address] [place]   Office  [phone]

## 2017-04-22 NOTE — ED Provider Notes (Signed)
[facility] Emergency Department Provider Note  Time seen: 12:06 AM  I have reviewed the triage vital signs and the nursing notes.   HISTORY  Chief Complaint Chest Pain    HPI Keith Chan is a 56 y.o. male with a past medical history of anxiety, gastric reflux, hypertension, hyperlipidemia, multiple MIs with multiple coronary stents who presents the emergency department for chest pain. According to the patient he was awoken from sleep around 8:30 PM tonight with moderate dull chest pain radiating into his throat and shoulders. Patient states the pain failed to resolve, he took 2 baby aspirin but continued to have pain so he came to the emergency department for evaluation. Upon arrival to the emergency department the patient states his pain has resolved. Denies any discomfort at this time. He states the pain does feel similar to past cardiac events. Does not feel like gastric reflux or indigestion to the patient. Patient says his last stent was in 2016. Denies any nausea, shortness of breath or diaphoresis at any time.  Past Medical History:  Diagnosis Date  . Anginal pain   . Anxiety   . Arthritis    "back; hands/wrists" (08/07/2014)  . Complication of anesthesia   . Family history of anesthesia complication    Mother- N/V  . GERD (gastroesophageal reflux disease)   . H/O hiatal hernia   . High cholesterol   . Hypertension   . PONV (postoperative nausea and vomiting)     Patient Active Problem List   Diagnosis Date Noted  . Spinal stenosis of lumbar region 08/07/2014    Past Surgical History:  Procedure Laterality Date  . CARDIAC CATHETERIZATION  2007   "looked around; qthing was fine"  . COLONOSCOPY W/ POLYPECTOMY    . CORONARY ANGIOPLASTY WITH STENT PLACEMENT  10/19/04   MId- Prox RCA  @ [place]  . POSTERIOR LUMBAR FUSION  08/07/2014   L5-S1    Prior to Admission medications   Medication Sig Start Date End Date Taking? Authorizing Provider   amLODipine (NORVASC) 5 MG tablet Take 5 mg by mouth daily.    [provider]  aspirin 81 MG chewable tablet Chew 81 mg by mouth daily.    [provider]  atenolol (TENORMIN) 100 MG tablet Take 100 mg by mouth daily.    [provider]  atorvastatin (LIPITOR) 20 MG tablet Take 20 mg by mouth daily.    [provider]  clopidogrel (PLAVIX) 75 MG tablet Take 75 mg by mouth daily.    [provider]  diazepam (VALIUM) 5 MG tablet Take 1 tablet (5 mg total) by mouth every 8 (eight) hours as needed for anxiety. 08/10/14   Newman Pies, MD  docusate sodium 100 MG CAPS Take 100 mg by mouth 2 (two) times daily. 08/10/14   Newman Pies, MD  HYDROmorphone (DILAUDID) 2 MG tablet Take 1 tablet (2 mg total) by mouth every 4 (four) hours as needed for severe pain. 08/10/14   Newman Pies, MD  pantoprazole (PROTONIX) 40 MG tablet Take 40 mg by mouth daily.    [provider]    Allergies  Allergen Reactions  . Hydrocodone-Acetaminophen Anaphylaxis    Throat closes up    No family history on file.  Social History Social History  Substance Use Topics  . Smoking status: Former Smoker    Packs/day: 3.00    Years: 6.00    Types: Cigarettes  . Smokeless tobacco: Never Used  Comment: "quit smoking cigarettes in 1982"  . Alcohol use 16.8 oz/week    28 Cans of beer per week     Comment: 08/07/2014 "2, 25oz beers/day"    Review of Systems Constitutional: Negative for fever. ENT: Negative for congestion Cardiovascular: Negative for chest pain. Respiratory: Negative for shortness of breath.Negative for cough. Gastrointestinal: Negative for abdominal pain, vomiting Musculoskeletal: Negative for back pain. Negative for leg pain or swelling. Skin: Negative for rash. Neurological: Negative for headache All other ROS negative  ____________________________________________   PHYSICAL EXAM:  VITAL SIGNS: ED Triage Vitals  Enc Vitals  Group     BP 04/21/17 2301 (!) 150/87     Pulse Rate 04/21/17 2301 98     Resp 04/21/17 2301 (!) 22     Temp --      Temp src --      SpO2 04/21/17 2301 94 %     Weight 04/21/17 2234 169 lb (76.7 kg)     Height 04/21/17 2234 5\' 3"  (1.6 m)     Head Circumference --      Peak Flow --      Pain Score 04/21/17 2234 7     Pain Loc --      Pain Edu? --      Excl. in [place]? --     Constitutional: Alert and oriented. Well appearing and in no distress. Eyes: Normal exam ENT   Head: Normocephalic and atraumatic.   Mouth/Throat: Mucous membranes are moist. Cardiovascular: Normal rate, regular rhythm. No murmur Respiratory: Normal respiratory effort without tachypnea nor retractions. Breath sounds are clear  Gastrointestinal: Soft and nontender. No distention.   Musculoskeletal: Nontender with normal range of motion in all extremities. No lower extremity tenderness or edema. Neurologic:  Normal speech and language. No gross focal neurologic deficits Skin:  Skin is warm, dry and intact.  Psychiatric: Mood and affect are normal.   ____________________________________________    EKG  EKG reviewed and interpreted by myself shows normal sinus rhythm at 99 bpm, narrow QRS, normal axis, large and normal intervals with nonspecific ST changes. No ST elevation.  ____________________________________________    RADIOLOGY  Chest x-ray negative  ____________________________________________   INITIAL IMPRESSION / ASSESSMENT AND PLAN / ED COURSE  Pertinent labs & imaging results that were available during my care of the patient were reviewed by me and considered in my medical decision making (see chart for details).  Patient presents to the emergency department for chest pain which awoke him from his sleep with radiation to the throat and bilateral shoulders. Patient states his pain is completely resolved at this time. He states 4 days ago he was mowing his lawn when he developed a similar  pain however he stopped and the pain went away. States the pain feels like prior cardiac events. Patient's troponin is mildly elevated 0.05, no history in our system of troponins in the past for comparison. However patient and his wife state they're troponins are always negative. We will dose aspirin and admitted to the hospital.  ____________________________________________   FINAL CLINICAL IMPRESSION(S) / ED DIAGNOSES  Chest pain    Harvest Dark, MD 04/22/17 0010

## 2017-04-22 NOTE — ED Notes (Signed)
Hospitalist at bedside 

## 2017-04-22 NOTE — ED Notes (Signed)
Date and time results received: 04/21/17 2358  Test: Troponin Critical Value: 0.05  Name of Provider Notified: Kerman Passey, MD  Orders Received? Or Actions Taken?: No orders/actions taken

## 2017-04-22 NOTE — H&P (Signed)
[place] [facility] [place] NAME: Keith Chan    MR#:  [id number]  DATE OF BIRTH:  [birth year]  DATE OF ADMISSION:  04/21/2017  PRIMARY CARE PHYSICIAN: Madelyn Brunner, MD   REQUESTING/REFERRING PHYSICIAN:   CHIEF COMPLAINT:   Chief Complaint  Patient presents with  . Chest Pain    HISTORY OF PRESENT ILLNESS: Keith Chan  is a 56 y.o. male with a known history of Arthritis, GERD, hyperlipidemia, hypertension, hiatal hernia presented to the emergency room with chest pain. Patient went to bed last night at 8 PM and woke up around 9 PM with left-sided chest discomfort. The chest pain was sharp in nature 6 out of 10 on a scale of 1-10 and radiated to the left arm. Patient has history of coronary artery disease and cardiac stents. No complaints of any shortness of breath, orthopnea. No history of any palpitations. First set of troponin 0.05. EKG normal sinus rhythm with no ST segment changes. Hospitalist service was consulted for further care of the patient. Patient follows up with [facility] cardiology as outpatient.  PAST MEDICAL HISTORY:   Past Medical History:  Diagnosis Date  . Anginal pain ([place])   . Anxiety   . Arthritis    "back; hands/wrists" (08/07/2014)  . Complication of anesthesia   . Family history of anesthesia complication    Mother- N/V  . GERD (gastroesophageal reflux disease)   . H/O hiatal hernia   . High cholesterol   . Hypertension   . PONV (postoperative nausea and vomiting)     PAST SURGICAL HISTORY: Past Surgical History:  Procedure Laterality Date  . CARDIAC CATHETERIZATION  2007   "looked around; qthing was fine"  . COLONOSCOPY W/ POLYPECTOMY    . CORONARY ANGIOPLASTY WITH STENT PLACEMENT  10/19/04   MId- Prox RCA  @ [place]  . POSTERIOR LUMBAR FUSION  08/07/2014   L5-S1    SOCIAL HISTORY:  Social History  Substance Use Topics  . Smoking status: Former Smoker    Packs/day: 3.00    Years: 6.00     Types: Cigarettes  . Smokeless tobacco: Never Used     Comment: "quit smoking cigarettes in 1982"  . Alcohol use 16.8 oz/week    28 Cans of beer per week     Comment: 08/07/2014 "2, 25oz beers/day"    FAMILY HISTORY:  Family History  Problem Relation Age of Onset  . CAD Mother   . Hyperlipidemia Mother   . CAD Father   . Hyperlipidemia Father     DRUG ALLERGIES:  Allergies  Allergen Reactions  . Hydrocodone-Acetaminophen Anaphylaxis    Throat closes up  . Codeine Sulfate Other (See Comments)    Felt like throat was closing up.  . Gemfibrozil Other (See Comments)    REVIEW OF SYSTEMS:   CONSTITUTIONAL: No fever, fatigue or weakness.  EYES: No blurred or double vision.  EARS, NOSE, AND THROAT: No tinnitus or ear pain.  RESPIRATORY: No cough, shortness of breath, wheezing or hemoptysis.  CARDIOVASCULAR: Has chest pain,  No orthopnea, edema.  GASTROINTESTINAL: No nausea, vomiting, diarrhea or abdominal pain.  GENITOURINARY: No dysuria, hematuria.  ENDOCRINE: No polyuria, nocturia,  HEMATOLOGY: No anemia, easy bruising or bleeding SKIN: No rash or lesion. MUSCULOSKELETAL: No joint pain or arthritis.   NEUROLOGIC: No tingling, numbness, weakness.  PSYCHIATRY: No anxiety or depression.   MEDICATIONS AT HOME:  Prior to Admission medications   Medication Sig Start Date End  Date Taking? Authorizing Provider  amLODipine (NORVASC) 10 MG tablet Take 1 tablet by mouth daily. 02/03/17  Yes [provider]  aspirin 81 MG chewable tablet Chew 81 mg by mouth daily.   Yes [provider]  atenolol (TENORMIN) 100 MG tablet Take 100 mg by mouth daily.   Yes [provider]  atorvastatin (LIPITOR) 20 MG tablet Take 20 mg by mouth daily.   Yes [provider]  clopidogrel (PLAVIX) 75 MG tablet Take 75 mg by mouth daily.   Yes [provider]  hydrochlorothiazide (HYDRODIURIL) 25 MG tablet Take 1 tablet by mouth daily. 01/13/17  Yes [provider]  pantoprazole (PROTONIX) 40 MG tablet Take 40 mg by mouth daily.   Yes [provider]      PHYSICAL EXAMINATION:   VITAL SIGNS: Blood pressure (!) 147/100, pulse 92, temperature 98.4 F (36.9 C), temperature source Oral, resp. rate 17, height 5\' 3"  (1.6 m), weight 76 kg (167 lb 8 oz), SpO2 97 %.  GENERAL:  56 y.o.-year-old patient lying in the bed with no acute distress.  EYES: Pupils equal, round, reactive to light and accommodation. No scleral icterus. Extraocular muscles intact.  HEENT: Head atraumatic, normocephalic. Oropharynx and nasopharynx clear.  NECK:  Supple, no jugular venous distention. No thyroid enlargement, no tenderness.  LUNGS: Normal breath sounds bilaterally, no wheezing, rales,rhonchi or crepitation. No use of accessory muscles of respiration.  CARDIOVASCULAR: S1, S2 normal. No murmurs, rubs, or gallops.  ABDOMEN: Soft, nontender, nondistended. Bowel sounds present. No organomegaly or mass.  EXTREMITIES: No pedal edema, cyanosis, or clubbing.  NEUROLOGIC: Cranial nerves II through XII are intact. Muscle strength 5/5 in all extremities. Sensation intact. Gait not checked.  PSYCHIATRIC: The patient is alert and oriented x 3.  SKIN: No obvious rash, lesion, or ulcer.   LABORATORY PANEL:   CBC  Recent Labs Lab 04/21/17 2235  WBC 8.5  HGB 14.6  HCT 43.2  PLT 238  MCV 88.8  MCH 30.0  MCHC 33.8  RDW 12.8   ------------------------------------------------------------------------------------------------------------------  Chemistries   Recent Labs Lab 04/21/17 2235  NA 137  K 2.9*  CL 107  CO2 23  GLUCOSE 169*  BUN 15  CREATININE 0.65  CALCIUM 9.7  AST 29  ALT 28  ALKPHOS 80  BILITOT 0.4   ------------------------------------------------------------------------------------------------------------------ estimated creatinine clearance is 92.9 mL/min (by C-G formula based on SCr of 0.65  mg/dL). ------------------------------------------------------------------------------------------------------------------ No results for input(s): TSH, T4TOTAL, T3FREE, THYROIDAB in the last 72 hours.  Invalid input(s): FREET3   Coagulation profile No results for input(s): INR, PROTIME in the last 168 hours. ------------------------------------------------------------------------------------------------------------------- No results for input(s): DDIMER in the last 72 hours. -------------------------------------------------------------------------------------------------------------------  Cardiac Enzymes  Recent Labs Lab 04/21/17 2235  TROPONINI 0.05*   ------------------------------------------------------------------------------------------------------------------ Invalid input(s): POCBNP  ---------------------------------------------------------------------------------------------------------------  Urinalysis No results found for: COLORURINE, APPEARANCEUR, LABSPEC, PHURINE, GLUCOSEU, HGBUR, BILIRUBINUR, KETONESUR, PROTEINUR, UROBILINOGEN, NITRITE, LEUKOCYTESUR   RADIOLOGY: Dg Chest 2 View  Result Date: 04/21/2017 CLINICAL DATA:  Chest pain EXAM: CHEST  2 VIEW COMPARISON:  07/30/2014 FINDINGS: Stable calcified lung nodule right base. No acute infiltrate or effusion. Normal cardiomediastinal silhouette. No pneumothorax. Mild degenerative changes of the spine. IMPRESSION: No active cardiopulmonary disease. Electronically Signed   By: Donavan Foil M.D.   On: 04/21/2017 22:55    EKG: Orders placed or performed during the hospital encounter of 04/21/17  . EKG 12-Lead  . EKG 12-Lead  . ED EKG  . ED EKG    IMPRESSION AND  PLAN: 56 year old male patient with history of coronary artery disease, cardiac stent, GERD, hyperlipidemia, hypertension presented to the emergency room with chest pain. Admitting diagnosis 1. Unstable angina 2. Hypokalemia 3. Coronary artery  disease 4. Hypertension 5. Hyperlipidemia Treatment plan Admit patient to telemetry observation bed Resume aspirin and Plavix orally Cycle troponin to rule out ischemia Replace potassium Cardiology consultation Resume cardiac medications DVT prophylaxis subcutaneous Lovenox 40 MG daily  All the records are reviewed and case discussed with ED provider. Management plans discussed with the patient, family and they are in agreement.  CODE STATUS:FULL CODE    Code Status Orders        Start     Ordered   04/22/17 0248  Full code  Continuous     04/22/17 0247    Code Status History    Date Active Date Inactive Code Status Order ID Comments User Context   08/07/2014  3:37 PM 08/10/2014  4:03 PM Full Code [id number]  Elaina Hoops, MD Inpatient       TOTAL TIME TAKING CARE OF THIS PATIENT: 50 minutes.    Saundra Shelling M.D on 04/22/2017 at 3:00 AM  Between 7am to 6pm - Pager - [phone]  After 6pm go to [email or web address] - password [email or web address] [facility]  [place] [facility]  Office  [phone]  CC: Primary care physician; Madelyn Brunner, MD

## 2017-04-23 NOTE — Discharge Summary (Signed)
[place] at [facility], [place] y.o., DOB 02/02/1961, MRN [id number]. Admission date: 04/21/2017 Discharge Date 04/23/2017 Primary MD Madelyn Brunner, MD Admitting Physician Saundra Shelling, MD  Admission Diagnosis  Chest pain, unspecified type [R07.9]  Discharge Diagnosis   Active Problems:   Chest pain due to unstable angina cardiac catheter is status post cardiac catheterization   Hypokalemia  Essential hypertension  Hyperlipidemia        Keith Chan  is a 56 y.o. male with a known history of Arthritis, GERD, hyperlipidemia, hypertension, hiatal hernia presented to the emergency room with chest pain.due to his coronary artery disease cardiology to patient to cardiac catheterization cardiac catheterization did show coronary artery disease however it was not amendable to stenting or any other intervention medical management was recommended.patient currently chest pain free period and is doing well cleared by cardiology for discharge.            Consults  cardiology  Significant Tests:  See full reports for all details    Dg Chest 2 View  Result Date: 04/21/2017 CLINICAL DATA:  Chest pain EXAM: CHEST  2 VIEW COMPARISON:  07/30/2014 FINDINGS: Stable calcified lung nodule right base. No acute infiltrate or effusion. Normal cardiomediastinal silhouette. No pneumothorax. Mild degenerative changes of the spine. IMPRESSION: No active cardiopulmonary disease. Electronically Signed   By: Donavan Foil M.D.   On: 04/21/2017 22:55       Today   Subjective:   Keith Chan  Feels better, cp now resolved  Objective:   Blood pressure 138/85, pulse 62, temperature 98 F (36.7 C), temperature source Oral, resp. rate 18, height 5\' 3"  (1.6 m), weight 167 lb 8 oz (76 kg), SpO2 95 %.  .  Intake/Output Summary (Last 24 hours) at 04/23/17 1448 Last data filed at 04/22/17 1707  Gross per 24 hour  Intake            822.5 ml  Output                 0 ml  Net            822.5 ml    Exam VITAL SIGNS: Blood pressure 138/85, pulse 62, temperature 98 F (36.7 C), temperature source Oral, resp. rate 18, height 5\' 3"  (1.6 m), weight 167 lb 8 oz (76 kg), SpO2 95 %.  GENERAL:  56 y.o.-year-old patient lying in the bed with no acute distress.  EYES: Pupils equal, round, reactive to light and accommodation. No scleral icterus. Extraocular muscles intact.  HEENT: Head atraumatic, normocephalic. Oropharynx and nasopharynx clear.  NECK:  Supple, no jugular venous distention. No thyroid enlargement, no tenderness.  LUNGS: Normal breath sounds bilaterally, no wheezing, rales,rhonchi or crepitation. No use of accessory muscles of respiration.  CARDIOVASCULAR: S1, S2 normal. No murmurs, rubs, or gallops.  ABDOMEN: Soft, nontender, nondistended. Bowel sounds present. No organomegaly or mass.  EXTREMITIES: No pedal edema, cyanosis, or clubbing.  NEUROLOGIC: Cranial nerves II through XII are intact. Muscle strength 5/5 in all extremities. Sensation intact. Gait not checked.  PSYCHIATRIC: The patient is alert and oriented x 3.  SKIN: No obvious rash, lesion, or ulcer.   Data Review     CBC w Diff: Lab Results  Component Value Date   WBC 7.4 04/22/2017   HGB 14.2 04/22/2017   HCT 41.9 04/22/2017   PLT 187 04/22/2017   CMP: Lab Results  Component Value Date   NA 138  04/22/2017   K 3.8 04/22/2017   CL 105 04/22/2017   CO2 27 04/22/2017   BUN 11 04/22/2017   CREATININE 0.53 (L) 04/22/2017   PROT 7.4 04/21/2017   ALBUMIN 4.3 04/21/2017   BILITOT 0.4 04/21/2017   ALKPHOS 80 04/21/2017   AST 29 04/21/2017   ALT 28 04/21/2017  .  Micro Results No results found for this or any previous visit (from the past 240 hour(s)).   Code Status History    Date Active Date Inactive Code Status Order ID Comments User Context   04/22/2017  2:47 AM 04/22/2017  9:29 PM Full Code [id number]  Saundra Shelling, MD Inpatient   08/07/2014  3:37 PM  08/10/2014  4:03 PM Full Code [id number]  Elaina Hoops, MD Inpatient          Follow-up Information    Lottie Mussel III, MD Follow up in 1 week(s).   Specialty:  Internal Medicine Why:  Please call and schedule this appointment as soon as possible. Office closed early on today (Friday). Thanks Contact information: [place] [address] [phone]        Isaias Cowman, MD Follow up in 1 week(s).   Specialty:  Cardiology Why:  Please call and schedule this appointment as soon as possible. Office closed early today (Friday). Thanks Contact information: [facility] [place] [address] [phone]           Discharge Medications   Allergies as of 04/22/2017      Reactions   Hydrocodone-acetaminophen Anaphylaxis   Throat closes up   Codeine Sulfate Other (See Comments)   Felt like throat was closing up.   Gemfibrozil Other (See Comments)      Medication List    TAKE these medications   amLODipine 10 MG tablet Commonly known as:  NORVASC Take 1 tablet by mouth daily.   aspirin 81 MG chewable tablet Chew 81 mg by mouth daily.   atenolol 100 MG tablet Commonly known as:  TENORMIN Take 100 mg by mouth daily.   atorvastatin 20 MG tablet Commonly known as:  LIPITOR Take 20 mg by mouth daily. Notes to patient:  Start tomorrow, you had a dose today   clopidogrel 75 MG tablet Commonly known as:  PLAVIX Take 75 mg by mouth daily.   hydrochlorothiazide 25 MG tablet Commonly known as:  HYDRODIURIL Take 1 tablet by mouth daily.   nitroGLYCERIN 0.4 MG SL tablet Commonly known as:  NITROSTAT Place 1 tablet (0.4 mg total) under the tongue every 5 (five) minutes as needed for chest pain.   pantoprazole 40 MG tablet Commonly known as:  PROTONIX Take 40 mg by mouth daily.          Total Time in preparing paper work, data evaluation and todays exam - 35  minutes  Dustin Flock M.D on 04/23/2017 at 2:48 PM  [facility]   Office  [phone]

## 2017-04-26 ENCOUNTER — Encounter: Payer: Self-pay | Admitting: Cardiology

## 2018-06-08 IMAGING — CR DG CHEST 2V
2 series · 2 of 2 positions shown · non-contrast
Comparison: 07/30/2014

CLINICAL DATA: Chest pain

EXAM:
CHEST  2 VIEW

[chest pa]
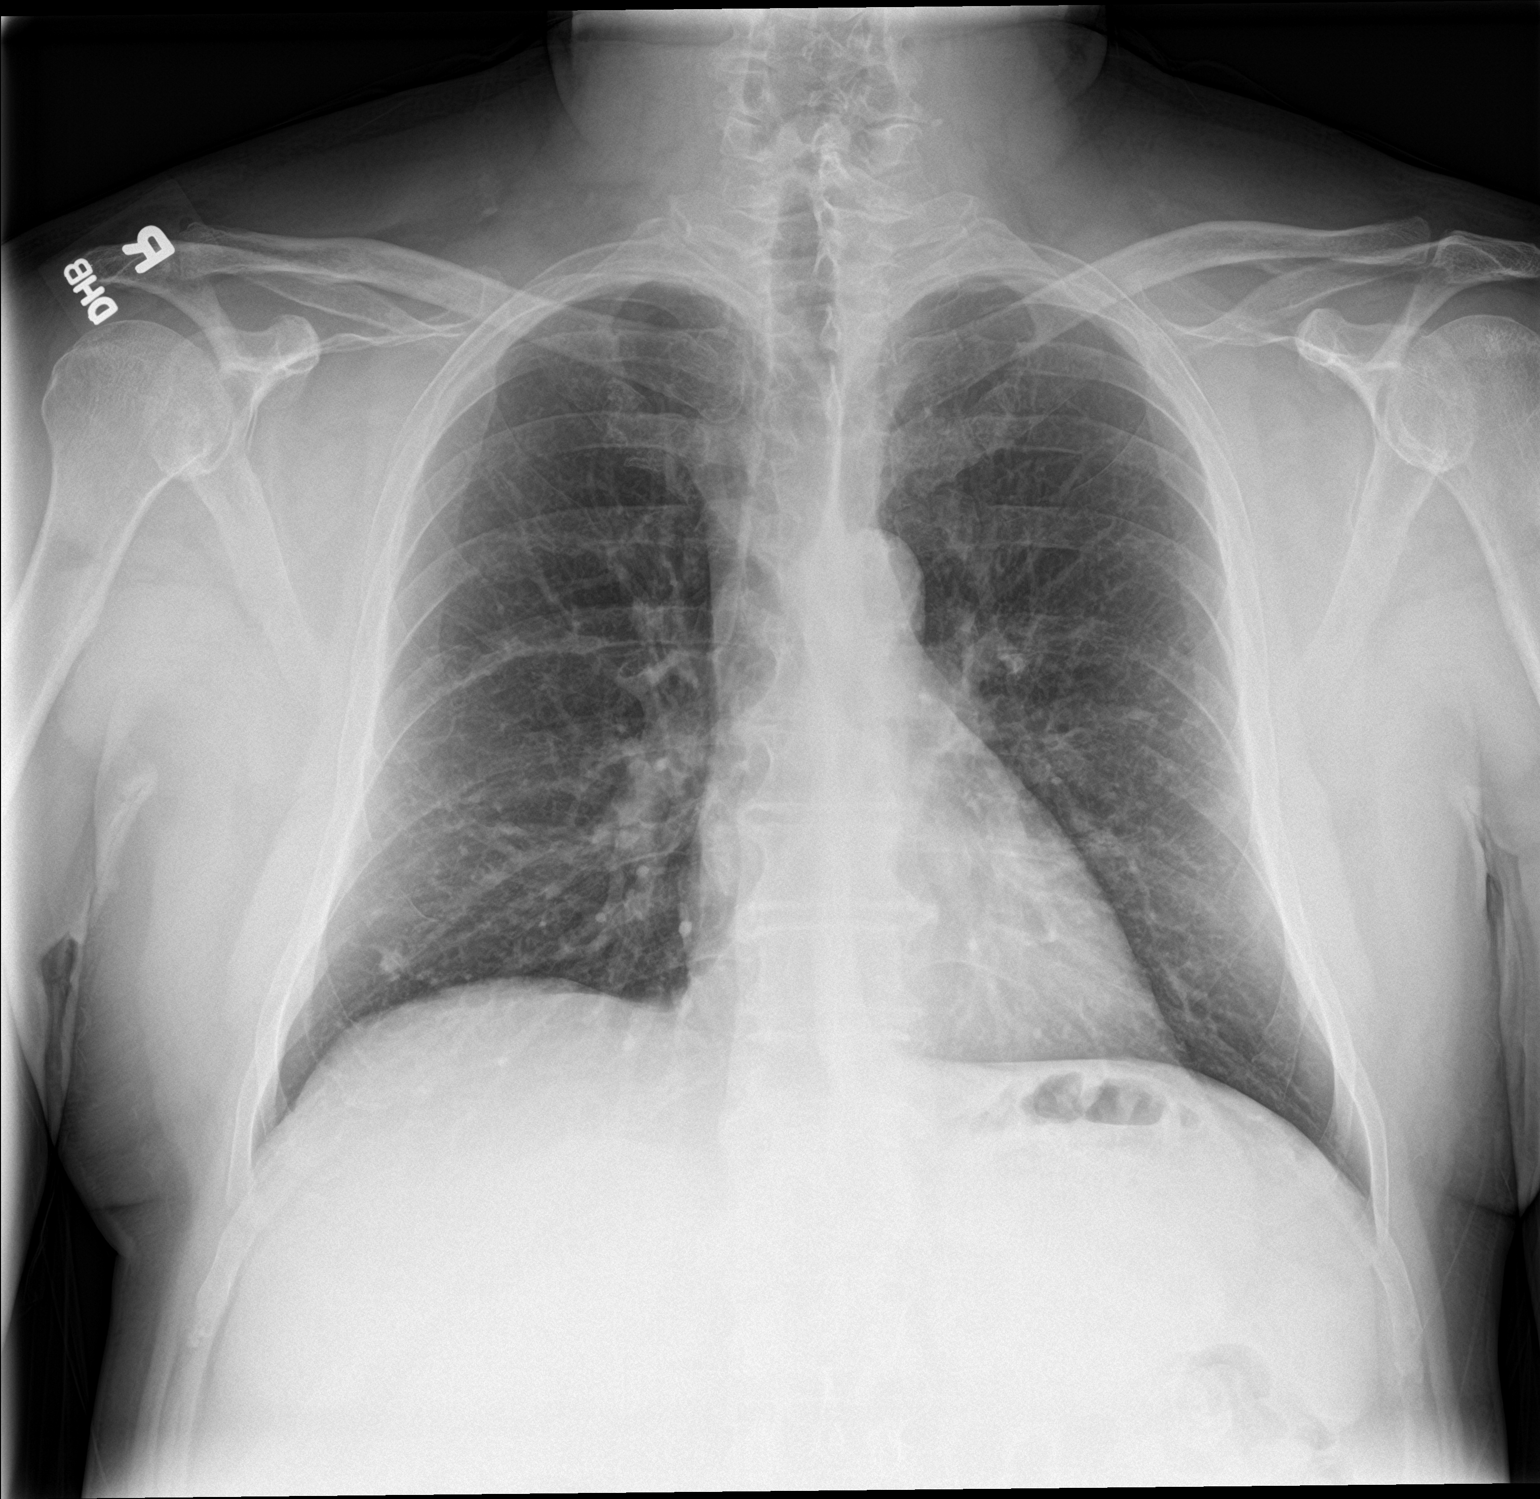

[chest lat]
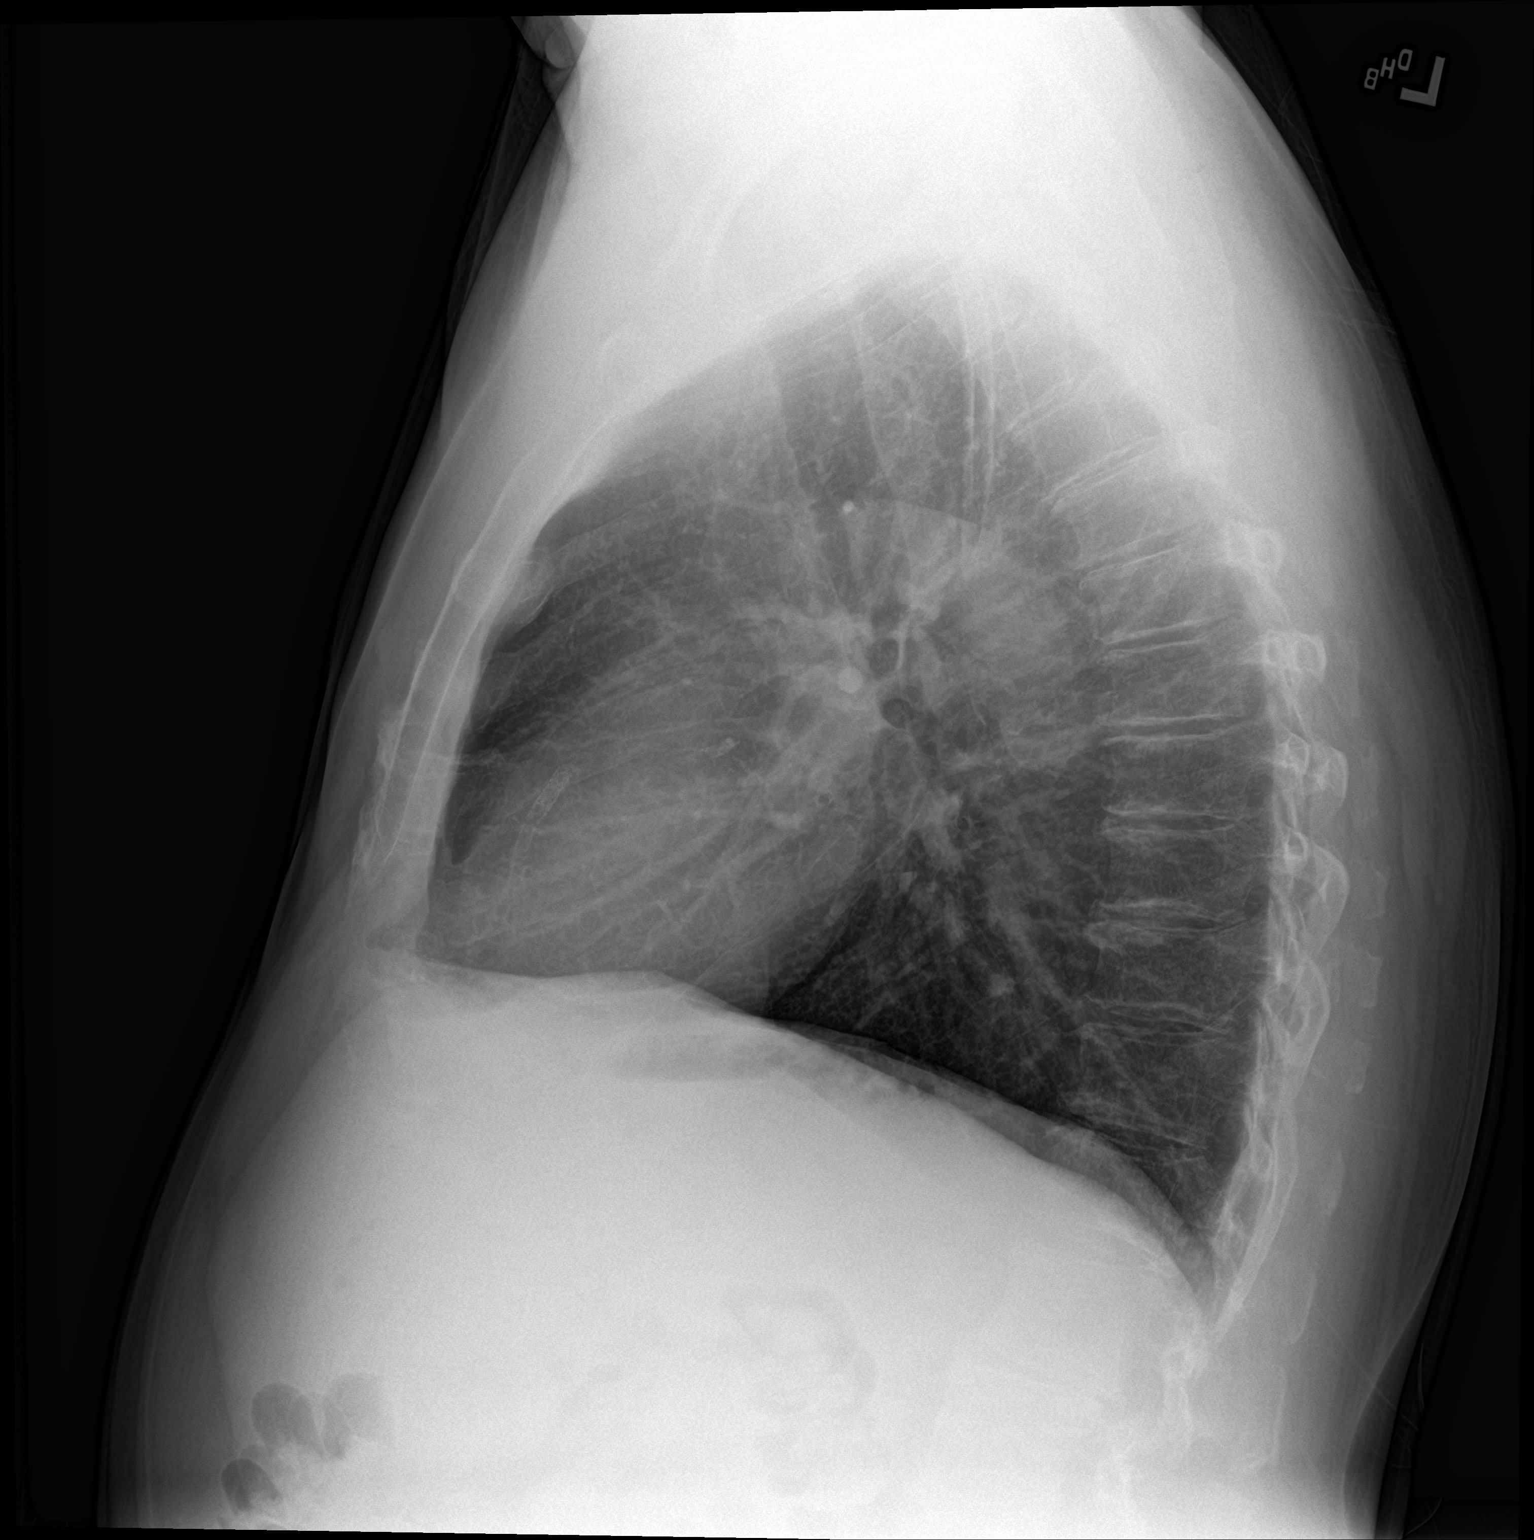

[2 of 2 positions shown; findings below may reference images not displayed]

FINDINGS: Stable calcified lung nodule right base. No acute infiltrate or
effusion. Normal cardiomediastinal silhouette. No pneumothorax. Mild
degenerative changes of the spine.
IMPRESSION: No active cardiopulmonary disease.

## 2018-07-26 ENCOUNTER — Other Ambulatory Visit: Payer: Self-pay | Admitting: Internal Medicine

## 2018-07-26 DIAGNOSIS — R05 Cough: Secondary | ICD-10-CM

## 2018-07-26 DIAGNOSIS — R059 Cough, unspecified: Secondary | ICD-10-CM

## 2018-08-02 ENCOUNTER — Ambulatory Visit
Admission: RE | Admit: 2018-08-02 | Discharge: 2018-08-02 | Disposition: A | Discharge status: Home / Self Care | Payer: BLUE CROSS/BLUE SHIELD | Source: Ambulatory Visit | Attending: Internal Medicine | Admitting: Internal Medicine

## 2018-08-02 DIAGNOSIS — J8410 Pulmonary fibrosis, unspecified: Secondary | ICD-10-CM | POA: Insufficient documentation

## 2018-08-02 DIAGNOSIS — R05 Cough: Secondary | ICD-10-CM | POA: Insufficient documentation

## 2018-08-02 DIAGNOSIS — R911 Solitary pulmonary nodule: Secondary | ICD-10-CM | POA: Diagnosis not present

## 2018-08-02 DIAGNOSIS — K760 Fatty (change of) liver, not elsewhere classified: Secondary | ICD-10-CM | POA: Insufficient documentation

## 2018-08-02 DIAGNOSIS — R059 Cough, unspecified: Secondary | ICD-10-CM

## 2018-08-02 MED ORDER — IOPAMIDOL (ISOVUE-300) INJECTION 61%
75.0000 mL | Freq: Once | INTRAVENOUS | Status: AC | PRN
  Administered 2018-08-02: 75 mL via INTRAVENOUS

## 2018-08-31 ENCOUNTER — Other Ambulatory Visit: Payer: Self-pay

## 2018-08-31 ENCOUNTER — Encounter: Payer: Self-pay | Admitting: Emergency Medicine

## 2018-08-31 ENCOUNTER — Ambulatory Visit (INDEPENDENT_AMBULATORY_CARE_PROVIDER_SITE_OTHER): Payer: BLUE CROSS/BLUE SHIELD

## 2018-08-31 ENCOUNTER — Ambulatory Visit
Admission: EM | Admit: 2018-08-31 | Discharge: 2018-08-31 | Disposition: A | Discharge status: Home / Self Care | Payer: BLUE CROSS/BLUE SHIELD | Attending: Family Medicine | Admitting: Family Medicine

## 2018-08-31 DIAGNOSIS — I10 Essential (primary) hypertension: Secondary | ICD-10-CM | POA: Diagnosis not present

## 2018-08-31 DIAGNOSIS — R0781 Pleurodynia: Secondary | ICD-10-CM | POA: Diagnosis not present

## 2018-08-31 DIAGNOSIS — R05 Cough: Secondary | ICD-10-CM | POA: Diagnosis not present

## 2018-08-31 MED ORDER — MELOXICAM 15 MG PO TABS: 15.0000 mg | ORAL_TABLET | Freq: Every day | ORAL | 0 refills | Status: AC | PRN

## 2018-08-31 NOTE — Discharge Instructions (Signed)
Use the medication as needed. ° °Take care ° °Dr. Ivone Licht  °

## 2018-08-31 NOTE — ED Triage Notes (Signed)
Patient c/o cough for about a year.  Patient states that he has had CT scan done.  Patient reports right sided mid back pain that has gotten worse over the past 3-4 days.  Patient reports pain is worse when he coughs.

## 2018-08-31 NOTE — ED Provider Notes (Signed)
[facility]    CSN: [id number] Arrival date & time: 08/31/18  1022  History   Chief Complaint Chief Complaint  Patient presents with  . Cough  . Back Pain   HPI  57 year old male presents with right lower rib pain.  Patient has ongoing chronic cough.  He is followed by pulmonology.  He states over the past 2 days he has had right lower rib pain.  Severe.  Worse with activity and coughing.  No relieving factors.  No reports of chest pain or shortness of breath.  No other associated symptoms.  No other complaints.  Past Medical History:  Diagnosis Date  . Anginal pain ([place])   . Anxiety   . Arthritis    "back; hands/wrists" (08/07/2014)  . Complication of anesthesia   . Family history of anesthesia complication    Mother- N/V  . GERD (gastroesophageal reflux disease)   . H/O hiatal hernia   . High cholesterol   . Hypertension   . PONV (postoperative nausea and vomiting)     Patient Active Problem List   Diagnosis Date Noted  . Chest pain 04/22/2017  . Spinal stenosis of lumbar region 08/07/2014    Past Surgical History:  Procedure Laterality Date  . CARDIAC CATHETERIZATION  2007   "looked around; qthing was fine"  . COLONOSCOPY W/ POLYPECTOMY    . CORONARY ANGIOPLASTY WITH STENT PLACEMENT  10/19/04   MId- Prox RCA  @ [place]  . LEFT HEART CATH AND CORONARY ANGIOGRAPHY N/A 04/22/2017   Procedure: Left Heart Cath and Coronary Angiography;  Surgeon: Isaias Cowman, MD;  Location: [facility];  Service: Cardiovascular;  Laterality: N/A;  . POSTERIOR LUMBAR FUSION  08/07/2014   L5-S1       Home Medications    Prior to Admission medications   Medication Sig Start Date End Date Taking? Authorizing Provider  amLODipine (NORVASC) 10 MG tablet Take 1 tablet by mouth daily. 02/03/17  Yes [provider]  aspirin 81 MG chewable tablet Chew 81 mg by mouth daily.   Yes [provider]  atenolol (TENORMIN) 100 MG tablet Take 100 mg by  mouth daily.   Yes [provider]  atorvastatin (LIPITOR) 20 MG tablet Take 20 mg by mouth daily.   Yes [provider]  clopidogrel (PLAVIX) 75 MG tablet Take 75 mg by mouth daily.   Yes [provider]  hydrochlorothiazide (HYDRODIURIL) 25 MG tablet Take 1 tablet by mouth daily. 01/13/17  Yes [provider]  losartan (COZAAR) 50 MG tablet Take by mouth. 08/29/18 08/29/19 Yes [provider]  nitroGLYCERIN (NITROSTAT) 0.4 MG SL tablet Place 1 tablet (0.4 mg total) under the tongue every 5 (five) minutes as needed for chest pain. 04/22/17  Yes Dustin Flock, MD  pantoprazole (PROTONIX) 40 MG tablet Take 40 mg by mouth daily.   Yes [provider]  meloxicam (MOBIC) 15 MG tablet Take 1 tablet (15 mg total) by mouth daily as needed for up to 7 days. 08/31/18 09/07/18  Coral Spikes, DO    Family History Family History  Problem Relation Age of Onset  . CAD Mother   . Hyperlipidemia Mother   . CAD Father   . Hyperlipidemia Father     Social History Social History   Tobacco Use  . Smoking status: Former Smoker    Packs/day: 3.00    Years: 6.00    Pack years: 18.00    Types: Cigarettes  . Smokeless tobacco: Never  Used  . Tobacco comment: "quit smoking cigarettes in 1982"  Substance Use Topics  . Alcohol use: Yes    Alcohol/week: 28.0 standard drinks    Types: 28 Cans of beer per week    Comment: 08/07/2014 "2, 25oz beers/day"  . Drug use: Yes    Types: Marijuana    Comment: "last drug use was 1979"     Allergies   Hydrocodone-acetaminophen; Codeine sulfate; and Gemfibrozil   Review of Systems Review of Systems  Respiratory: Positive for cough.   Cardiovascular: Negative.   Musculoskeletal:       R lower rib pain.   Physical Exam Triage Vital Signs ED Triage Vitals  Enc Vitals Group     BP 08/31/18 1039 (!) 143/88     Pulse Rate 08/31/18 1039 70     Resp 08/31/18 1039 16     Temp 08/31/18 1039 98.3 F (36.8 C)      Temp Source 08/31/18 1039 Oral     SpO2 08/31/18 1039 97 %     Weight 08/31/18 1036 170 lb (77.1 kg)     Height 08/31/18 1036 5\' 2"  (1.575 m)     Head Circumference --      Peak Flow --      Pain Score 08/31/18 1036 9     Pain Loc --      Pain Edu? --      Excl. in [place]? --    Updated Vital Signs BP (!) 143/88 (BP Location: Left Arm)   Pulse 70   Temp 98.3 F (36.8 C) (Oral)   Resp 16   Ht 5\' 2"  (1.575 m)   Wt 77.1 kg   SpO2 97%   BMI 31.09 kg/m   Visual Acuity Right Eye Distance:   Left Eye Distance:   Bilateral Distance:    Right Eye Near:   Left Eye Near:    Bilateral Near:     Physical Exam  Constitutional: He is oriented to person, place, and time. He appears well-developed. No distress.  Cardiovascular: Normal rate and regular rhythm.  Pulmonary/Chest: Effort normal and breath sounds normal.  Musculoskeletal:  R lower rib tenderness, laterally.   Neurological: He is alert and oriented to person, place, and time.  Psychiatric: He has a normal mood and affect. His behavior is normal.  Nursing note and vitals reviewed.  UC Treatments / Results  Labs (all labs ordered are listed, but only abnormal results are displayed) Labs Reviewed - No data to display  EKG None  Radiology Dg Ribs Unilateral W/chest Right  Result Date: 08/31/2018 CLINICAL DATA:  Productive cough for 1 year. History of pulmonary nodules. Right rib pain for 2 days. EXAM: RIGHT RIBS AND CHEST - 3+ VIEW COMPARISON:  PA and lateral chest 04/21/2017.  CT chest 08/02/2018. FINDINGS: The 2 mm left upper lobe pulmonary nodule seen on prior CT is not visible on plain film. Punctate calcified right lower lobe granuloma is noted. The lungs are otherwise unremarkable. Heart size is normal. No pneumothorax or pleural effusion. No fracture or focal bony lesion is identified. Postoperative change of lumbar fusion noted. IMPRESSION: Negative for fracture.  No acute disease. Punctate right lower lobe calcified  granuloma. 2 mm left upper lobe nodule seen on prior CT is not visible on this exam. Electronically Signed   By: Inge Rise M.D.   On: 08/31/2018 11:37   Procedures Procedures (including critical care time)  Medications Ordered in UC Medications - No data to display  Initial Impression / Assessment and Plan / UC Course  I have reviewed the triage vital signs and the nursing notes.  Pertinent labs & imaging results that were available during my care of the patient were reviewed by me and considered in my medical decision making (see chart for details).    57 year old male presents with rib pain.  We discussed the merits of doing x-ray and patient states that his wife was adamant that he have an x-ray.  X-ray was negative.  Treating with meloxicam as needed.  To be used sparingly given his history.  Final Clinical Impressions(s) / UC Diagnoses   Final diagnoses:  Rib pain     Discharge Instructions     Use the medication as needed.  Take care  Dr. Lacinda Axon    ED Prescriptions    Medication Sig Dispense Auth. Provider   meloxicam (MOBIC) 15 MG tablet Take 1 tablet (15 mg total) by mouth daily as needed for up to 7 days. 30 tablet Coral Spikes, DO     Controlled Substance Prescriptions [place] Controlled Substance Registry consulted? Not Applicable   Coral Spikes, DO 08/31/18 1214

## 2018-09-07 ENCOUNTER — Other Ambulatory Visit: Payer: Self-pay | Admitting: Internal Medicine

## 2018-09-07 ENCOUNTER — Ambulatory Visit
Admission: RE | Admit: 2018-09-07 | Discharge: 2018-09-07 | Disposition: A | Discharge status: Home / Self Care | Payer: BLUE CROSS/BLUE SHIELD | Source: Ambulatory Visit | Attending: Internal Medicine | Admitting: Internal Medicine

## 2018-09-07 DIAGNOSIS — I2510 Atherosclerotic heart disease of native coronary artery without angina pectoris: Secondary | ICD-10-CM | POA: Insufficient documentation

## 2018-09-07 DIAGNOSIS — K760 Fatty (change of) liver, not elsewhere classified: Secondary | ICD-10-CM | POA: Insufficient documentation

## 2018-09-07 DIAGNOSIS — R1011 Right upper quadrant pain: Secondary | ICD-10-CM | POA: Diagnosis present

## 2018-09-07 LAB — POCT I-STAT CREATININE: CREATININE: 0.6 mg/dL — AB (ref 0.61–1.24)

## 2018-09-07 MED ORDER — IOPAMIDOL (ISOVUE-300) INJECTION 61%
100.0000 mL | Freq: Once | INTRAVENOUS | Status: AC | PRN
  Administered 2018-09-07: 100 mL via INTRAVENOUS

## 2019-07-25 ENCOUNTER — Other Ambulatory Visit: Payer: Self-pay | Admitting: Internal Medicine

## 2019-07-25 DIAGNOSIS — R911 Solitary pulmonary nodule: Secondary | ICD-10-CM

## 2019-07-31 ENCOUNTER — Ambulatory Visit
Admission: RE | Admit: 2019-07-31 | Discharge: 2019-07-31 | Disposition: A | Discharge status: Home / Self Care | Payer: BC Managed Care – PPO | Source: Ambulatory Visit | Attending: Internal Medicine | Admitting: Internal Medicine

## 2019-07-31 ENCOUNTER — Other Ambulatory Visit: Payer: Self-pay

## 2019-07-31 DIAGNOSIS — R911 Solitary pulmonary nodule: Secondary | ICD-10-CM | POA: Insufficient documentation

## 2019-07-31 MED ORDER — IOHEXOL 300 MG/ML  SOLN
75.0000 mL | Freq: Once | INTRAMUSCULAR | Status: AC | PRN
  Administered 2019-07-31: 75 mL via INTRAVENOUS

## 2019-10-18 IMAGING — CR DG RIBS W/ CHEST 3+V*R*
5 series · 5 of 5 positions shown · non-contrast
Comparison: PA and lateral chest 04/21/2017.  CT chest 08/02/2018.

CLINICAL DATA: Productive cough for 1 year. History of pulmonary
nodules. Right rib pain for 2 days.

EXAM:
RIGHT RIBS AND CHEST - 3+ VIEW

[chest pa]
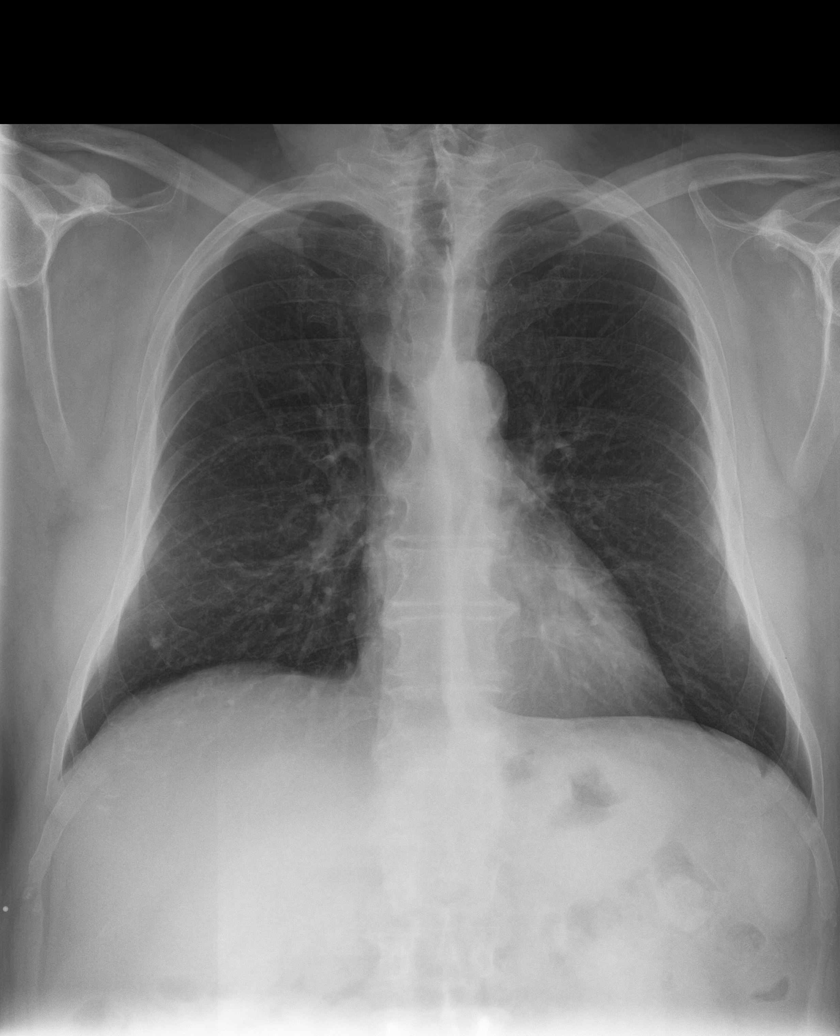

[rib pa (1 of 2)]
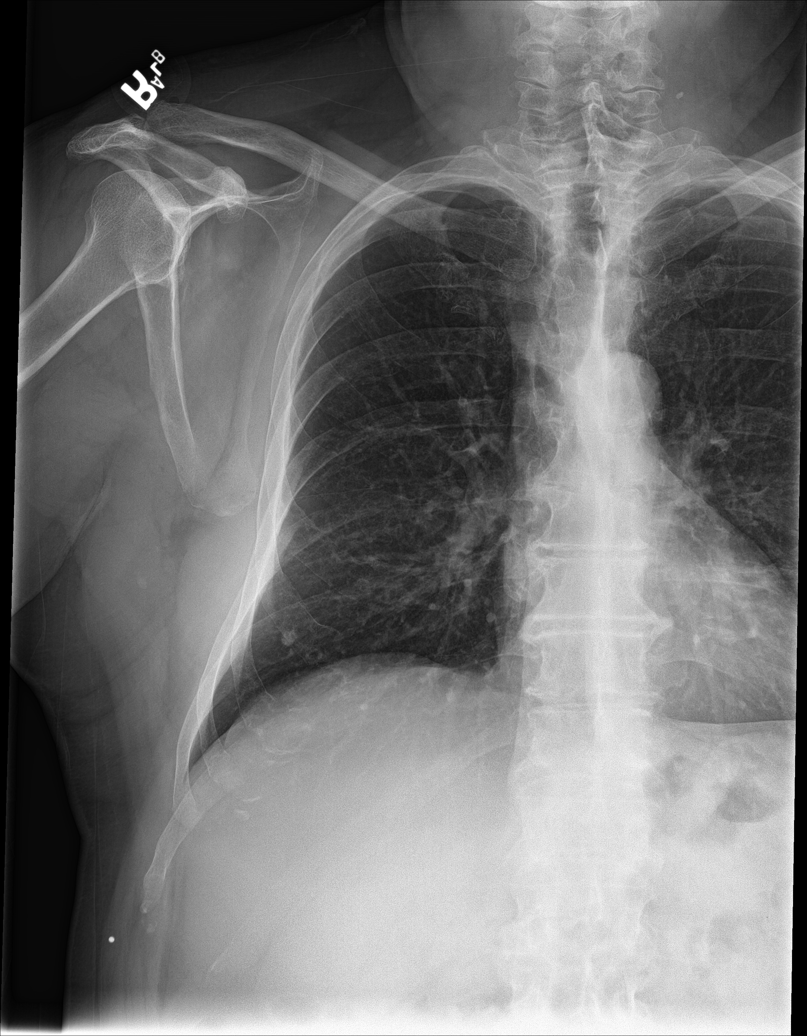

[rib pa (2 of 2)]
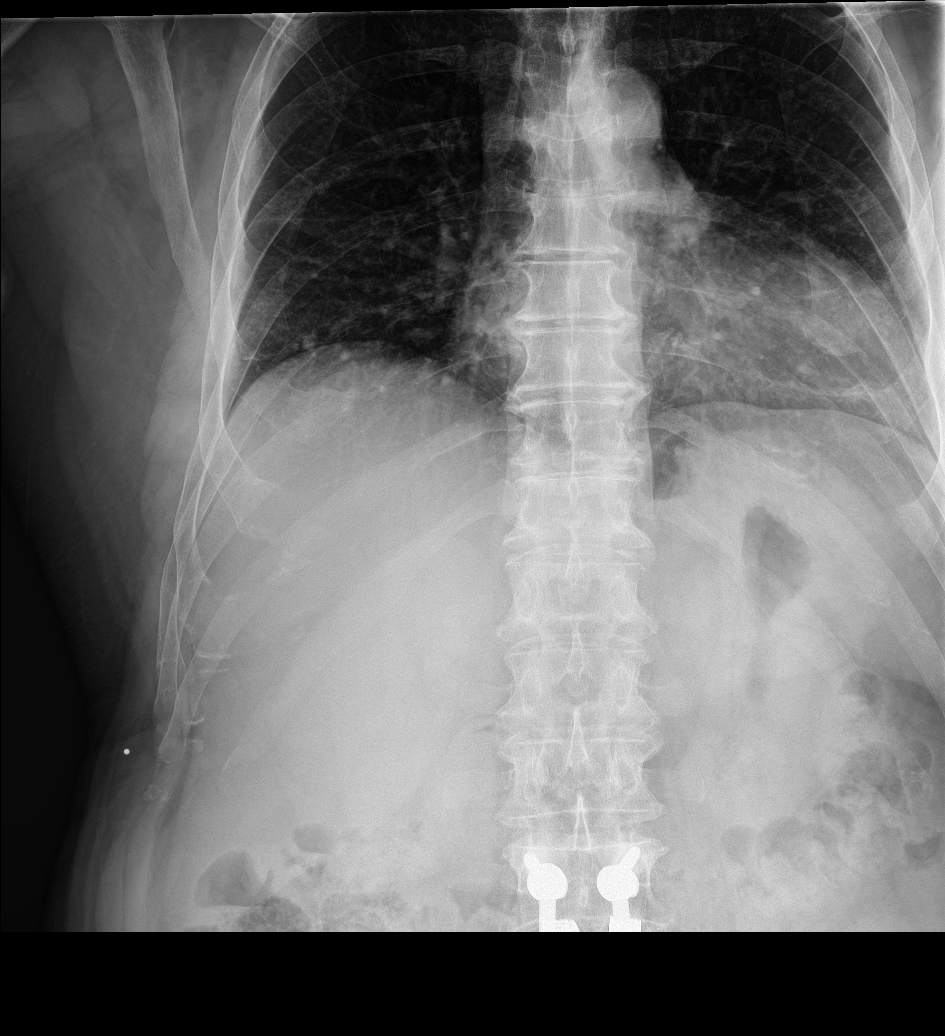

[rib obl (1 of 2)]
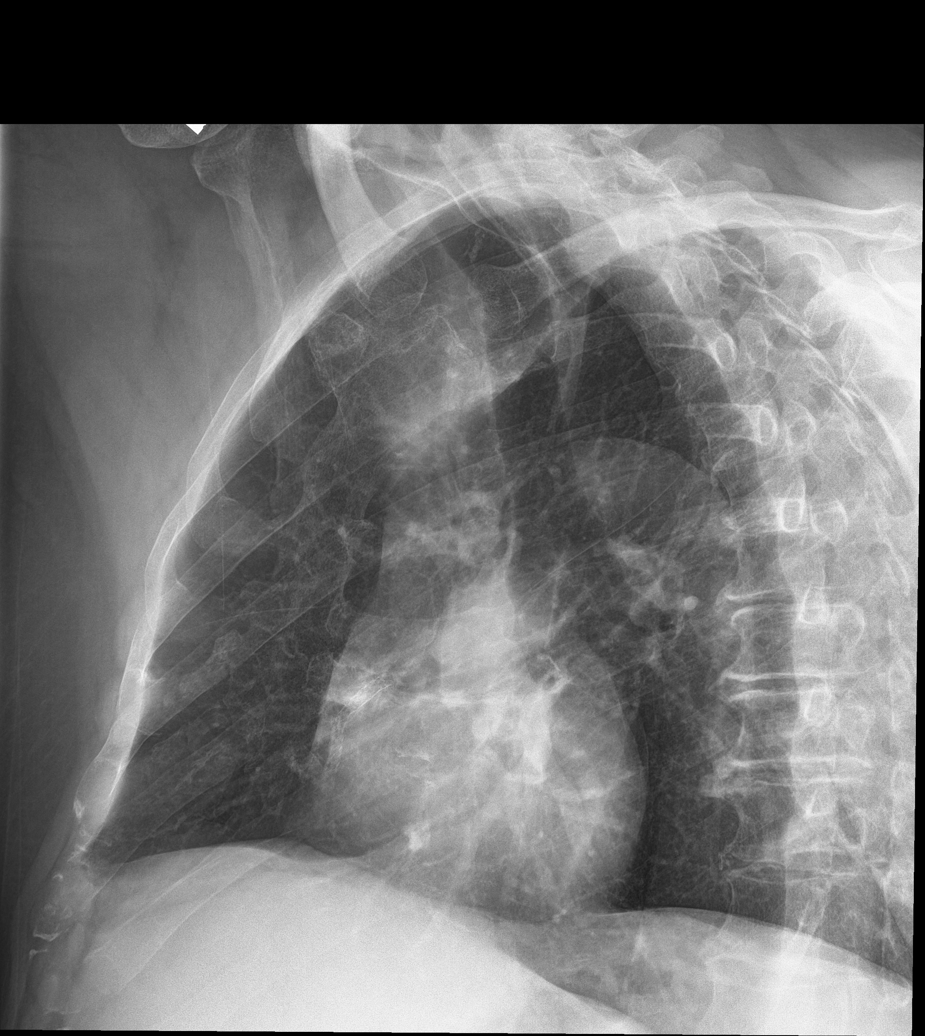

[rib obl (2 of 2)]
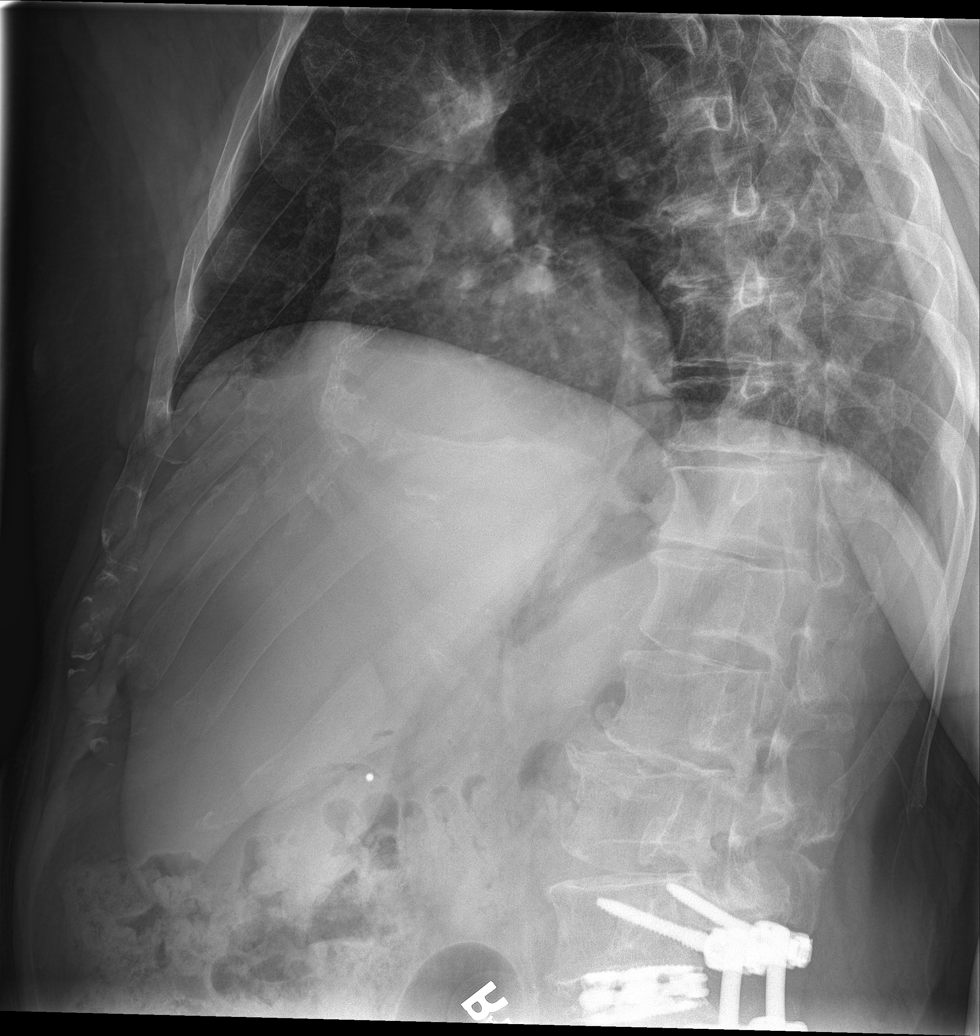

[5 of 5 positions shown; findings below may reference images not displayed]

FINDINGS: The 2 mm left upper lobe pulmonary nodule seen on prior CT is not
visible on plain film. Punctate calcified right lower lobe granuloma
is noted. The lungs are otherwise unremarkable. Heart size is
normal. No pneumothorax or pleural effusion. No fracture or focal
bony lesion is identified. Postoperative change of lumbar fusion
noted.
IMPRESSION: Negative for fracture.  No acute disease.

Punctate right lower lobe calcified granuloma. 2 mm left upper lobe
nodule seen on prior CT is not visible on this exam.

## 2021-06-26 ENCOUNTER — Encounter: Payer: Self-pay | Admitting: Internal Medicine

## 2021-06-29 ENCOUNTER — Encounter: Payer: Self-pay | Admitting: Internal Medicine

## 2021-06-29 ENCOUNTER — Other Ambulatory Visit: Payer: Self-pay

## 2021-06-29 ENCOUNTER — Ambulatory Visit: Payer: 59 | Admitting: Anesthesiology

## 2021-06-29 ENCOUNTER — Encounter
Admission: RE | Disposition: A | Discharge status: Home / Self Care | Payer: Self-pay | Source: Home / Self Care | Attending: Internal Medicine

## 2021-06-29 ENCOUNTER — Ambulatory Visit
Admission: RE | Admit: 2021-06-29 | Discharge: 2021-06-29 | Disposition: A | Discharge status: Home / Self Care | Payer: 59 | Attending: Internal Medicine | Admitting: Internal Medicine

## 2021-06-29 DIAGNOSIS — Z7982 Long term (current) use of aspirin: Secondary | ICD-10-CM | POA: Diagnosis not present

## 2021-06-29 DIAGNOSIS — Z7902 Long term (current) use of antithrombotics/antiplatelets: Secondary | ICD-10-CM | POA: Insufficient documentation

## 2021-06-29 DIAGNOSIS — Z888 Allergy status to other drugs, medicaments and biological substances status: Secondary | ICD-10-CM | POA: Insufficient documentation

## 2021-06-29 DIAGNOSIS — Z87891 Personal history of nicotine dependence: Secondary | ICD-10-CM | POA: Insufficient documentation

## 2021-06-29 DIAGNOSIS — Z79899 Other long term (current) drug therapy: Secondary | ICD-10-CM | POA: Insufficient documentation

## 2021-06-29 DIAGNOSIS — K640 First degree hemorrhoids: Secondary | ICD-10-CM | POA: Insufficient documentation

## 2021-06-29 DIAGNOSIS — Z885 Allergy status to narcotic agent status: Secondary | ICD-10-CM | POA: Insufficient documentation

## 2021-06-29 DIAGNOSIS — Z1211 Encounter for screening for malignant neoplasm of colon: Secondary | ICD-10-CM | POA: Insufficient documentation

## 2021-06-29 DIAGNOSIS — Z955 Presence of coronary angioplasty implant and graft: Secondary | ICD-10-CM | POA: Insufficient documentation

## 2021-06-29 DIAGNOSIS — D125 Benign neoplasm of sigmoid colon: Secondary | ICD-10-CM | POA: Insufficient documentation

## 2021-06-29 HISTORY — DX: Atherosclerotic heart disease of native coronary artery without angina pectoris: I25.10

## 2021-06-29 HISTORY — PX: COLONOSCOPY WITH PROPOFOL: SHX5780

## 2021-06-29 HISTORY — DX: Sleep apnea, unspecified: G47.30

## 2021-06-29 SURGERY — COLONOSCOPY WITH PROPOFOL
Anesthesia: General

## 2021-06-29 MED ORDER — MIDAZOLAM HCL 2 MG/2ML IJ SOLN
INTRAMUSCULAR | Status: DC | PRN
  Administered 2021-06-29: 2 mg via INTRAVENOUS

## 2021-06-29 MED ORDER — FENTANYL CITRATE (PF) 100 MCG/2ML IJ SOLN
INTRAMUSCULAR | Status: AC
  Filled 2021-06-29: qty 2

## 2021-06-29 MED ORDER — LIDOCAINE HCL (CARDIAC) PF 100 MG/5ML IV SOSY
PREFILLED_SYRINGE | INTRAVENOUS | Status: DC | PRN
  Administered 2021-06-29: 50 mg via INTRAVENOUS

## 2021-06-29 MED ORDER — MIDAZOLAM HCL 2 MG/2ML IJ SOLN
INTRAMUSCULAR | Status: AC
  Filled 2021-06-29: qty 2

## 2021-06-29 MED ORDER — PROPOFOL 500 MG/50ML IV EMUL
INTRAVENOUS | Status: DC | PRN
  Administered 2021-06-29: 50 ug/kg/min via INTRAVENOUS

## 2021-06-29 MED ORDER — FENTANYL CITRATE (PF) 100 MCG/2ML IJ SOLN
INTRAMUSCULAR | Status: DC | PRN
  Administered 2021-06-29 (×2): 50 ug via INTRAVENOUS

## 2021-06-29 MED ORDER — PROPOFOL 10 MG/ML IV BOLUS
INTRAVENOUS | Status: DC | PRN
  Administered 2021-06-29: 50 mg via INTRAVENOUS

## 2021-06-29 MED ORDER — SODIUM CHLORIDE 0.9 % IV SOLN: INTRAVENOUS | Status: DC

## 2021-06-29 NOTE — Interval H&P Note (Signed)
History and Physical Interval Note:  06/29/2021 2:03 PM  Keith Chan  has presented today for surgery, with the diagnosis of history of polyps.  The various methods of treatment have been discussed with the patient and family. After consideration of risks, benefits and other options for treatment, the patient has consented to  Procedure(s): COLONOSCOPY WITH PROPOFOL (N/A) as a surgical intervention.  The patient's history has been reviewed, patient examined, no change in status, stable for surgery.  I have reviewed the patient's chart and labs.  Questions were answered to the patient's satisfaction.     [place], [place]

## 2021-06-29 NOTE — H&P (Signed)
Outpatient short stay form Pre-procedure 06/29/2021 2:02 PM Trace Wirick K. Alice Reichert, M.D.  Primary Physician: Harrel Lemon, M.D.  Reason for visit:  Colon cancer screening  History of present illness:  Patient presents for colonoscopy for colon cancer screening. The patient denies complaints of abdominal pain, significant change in bowel habits, or rectal bleeding.      Current Facility-Administered Medications:    0.9 %  sodium chloride infusion, , Intravenous, Continuous, [place], Benay Pike, MD, Last Rate: 20 mL/hr at 06/29/21 1324, New Bag at 06/29/21 1324  Medications Prior to Admission  Medication Sig Dispense Refill Last Dose   amLODipine (NORVASC) 10 MG tablet Take 1 tablet by mouth daily.  3 06/29/2021 at 0630   atenolol (TENORMIN) 100 MG tablet Take 100 mg by mouth daily.   06/29/2021 at 0630   atorvastatin (LIPITOR) 20 MG tablet Take 20 mg by mouth daily.   06/28/2021 at 2030   clopidogrel (PLAVIX) 75 MG tablet Take 75 mg by mouth daily.   06/22/2021   hydrochlorothiazide (HYDRODIURIL) 25 MG tablet Take 1 tablet by mouth daily.  3 06/29/2021 at 0630   ibuprofen (ADVIL) 200 MG tablet Take 200 mg by mouth every 6 (six) hours as needed.   06/27/2021   losartan (COZAAR) 50 MG tablet Take by mouth.   06/29/2021 at 0630   albuterol (VENTOLIN HFA) 108 (90 Base) MCG/ACT inhaler Inhale 2 puffs into the lungs every 6 (six) hours as needed for wheezing or shortness of breath. (Patient not taking: Reported on 06/29/2021)   Not Taking   aspirin 81 MG chewable tablet Chew 81 mg by mouth daily.   06/27/2021   nitroGLYCERIN (NITROSTAT) 0.4 MG SL tablet Place 1 tablet (0.4 mg total) under the tongue every 5 (five) minutes as needed for chest pain. (Patient not taking: Reported on 06/29/2021) 10 tablet 12 Not Taking   pantoprazole (PROTONIX) 40 MG tablet Take 40 mg by mouth daily.   06/27/2021     Allergies  Allergen Reactions   Hydrocodone-Acetaminophen Anaphylaxis    Throat closes up   Codeine Sulfate Other  (See Comments)    Felt like throat was closing up.   Gemfibrozil Other (See Comments)    Muscle pain     Past Medical History:  Diagnosis Date   Anginal pain ([place])    Anxiety    Arthritis    "back; hands/wrists" ([id number])   Complication of anesthesia    Coronary artery disease    Family history of anesthesia complication    Mother- N/V   GERD (gastroesophageal reflux disease)    H/O hiatal hernia    High cholesterol    Hypertension    PONV (postoperative nausea and vomiting)    Sleep apnea     Review of systems:  Otherwise negative.    Physical Exam  Gen: Alert, oriented. Appears stated age.  HEENT: [place]/AT. PERRLA. Lungs: CTA, no wheezes. CV: RR nl S1, S2. Abd: soft, benign, no masses. BS+ Ext: No edema. Pulses 2+    Planned procedures: Proceed with colonoscopy. The patient understands the nature of the planned procedure, indications, risks, alternatives and potential complications including but not limited to bleeding, infection, perforation, damage to internal organs and possible oversedation/side effects from anesthesia. The patient agrees and gives consent to proceed.  Please refer to procedure notes for findings, recommendations and patient disposition/instructions.     Britlyn Martine K. Alice Reichert, M.D. Gastroenterology 06/29/2021  2:02 PM

## 2021-06-29 NOTE — Op Note (Addendum)
[facility] Gastroenterology Patient Name: Keith Chan Procedure Date: 06/29/2021 2:01 PM MRN: [id number] Account #: [id number] Date of Birth: 05/17/1961 Admit Type: Outpatient Age: 60 Room: [facility] ENDO ROOM 2 Gender: Male Note Status: Finalized Procedure:             Colonoscopy Indications:           Personal history of colonic polyps Providers:             Benay Pike. Alice Reichert MD, MD Referring MD:          Baxter Hire, MD (Referring MD) Medicines:             Propofol per Anesthesia Complications:         No immediate complications. Procedure:             Pre-Anesthesia Assessment:                        - The risks and benefits of the procedure and the                         sedation options and risks were discussed with the                         patient. All questions were answered and informed                         consent was obtained.                        - Patient identification and proposed procedure were                         verified prior to the procedure by the nurse. The                         procedure was verified in the procedure room.                        - ASA Grade Assessment: III - A patient with severe                         systemic disease.                        - After reviewing the risks and benefits, the patient                         was deemed in satisfactory condition to undergo the                         procedure.                        After obtaining informed consent, the colonoscope was                         passed under direct vision. Throughout the procedure,                         the patient's blood pressure,  pulse, and oxygen                         saturations were monitored continuously. The                         Colonoscope was introduced through the anus and                         advanced to the the cecum, identified by appendiceal                         orifice and ileocecal valve. The  colonoscopy was                         performed without difficulty. The patient tolerated                         the procedure well. The quality of the bowel                         preparation was good. The ileocecal valve, appendiceal                         orifice, and rectum were photographed. Findings:      The perianal and digital rectal examinations were normal. Pertinent       negatives include normal sphincter tone and no palpable rectal lesions.      Non-bleeding internal hemorrhoids were found during retroflexion. The       hemorrhoids were Grade I (internal hemorrhoids that do not prolapse).      A 12 mm polyp was found in the sigmoid colon. The polyp was       semi-sessile. Polypectomy was attempted, initially using a jumbo cold       forceps. Polyp resection was incomplete with this device. This       intervention then required a different device and polypectomy technique.       The polyp was removed with a hot snare. Resection and retrieval were       complete.      The exam was otherwise without abnormality. Impression:            - Non-bleeding internal hemorrhoids.                        - One 12 mm polyp in the sigmoid colon, removed with a                         hot snare. Resected and retrieved.                        - The examination was otherwise normal. Recommendation:        - Patient has a contact number available for                         emergencies. The signs and symptoms of potential                         delayed complications were discussed with the patient.  Return to normal activities tomorrow. Written                         discharge instructions were provided to the patient.                        - Resume previous diet.                        - Continue present medications.                        - Repeat colonoscopy is recommended for surveillance.                         The colonoscopy date will be determined after                          pathology results from today's exam become available                         for review.                        - Return to GI office PRN.                        - The findings and recommendations were discussed with                         the patient.                        - Resume Plavix (clopidogrel) at prior dose tomorrow.                         Refer to managing physician for further adjustment of                         therapy. Procedure Code(s):     --- Professional ---                        [phone], Colonoscopy, flexible; with removal of                         tumor(s), polyp(s), or other lesion(s) by snare                         technique Diagnosis Code(s):     --- Professional ---                        K64.0, First degree hemorrhoids                        K63.5, Polyp of colon                        Z83.71, Family history of colonic polyps CPT copyright 2019 American Medical Association. All rights reserved. The codes documented in this report are preliminary and upon coder review may  be revised to meet current compliance requirements.  Efrain Sella MD, MD 06/29/2021 2:31:20 PM This report has been signed electronically. Number of Addenda: 0 Note Initiated On: 06/29/2021 2:01 PM Scope Withdrawal Time: 0 hours 10 minutes 40 seconds  Total Procedure Duration: 0 hours 12 minutes 39 seconds  Estimated Blood Loss:  Estimated blood loss: none.      [facility]

## 2021-06-29 NOTE — Anesthesia Preprocedure Evaluation (Signed)
Anesthesia Evaluation  Patient identified by MRN, date of birth, ID band Patient awake    Reviewed: Allergy & Precautions, H&P , NPO status , Patient's Chart, lab work & pertinent test results  Airway Mallampati: I  TM Distance: >3 FB Neck ROM: Full    Dental  (+) Dental Advisory Given, Teeth Intact   Pulmonary sleep apnea and Continuous Positive Airway Pressure Ventilation , former smoker,    breath sounds clear to auscultation       Cardiovascular Exercise Tolerance: Good hypertension, Pt. on medications Angina: Stent 2005, on PLAVIX, to stop5 days prior, ckearedby [facility]. + CAD and + Cardiac Stents   Rhythm:Regular Rate:Normal     Neuro/Psych negative neurological ROS  negative psych ROS   GI/Hepatic Neg liver ROS, hiatal hernia, GERD  Controlled,  Endo/Other    Renal/GU negative Renal ROS     Musculoskeletal  (+) Arthritis ,   Abdominal (+) + obese,   Peds  Hematology negative hematology ROS (+)   Anesthesia Other Findings   Reproductive/Obstetrics                             Anesthesia Physical  Anesthesia Plan  ASA: III  Anesthesia Plan: General   Post-op Pain Management:    Induction: Intravenous  PONV Risk Score and Plan: 1 and TIVA and Treatment may vary due to age or medical condition  Airway Management Planned: Nasal Cannula and Natural Airway  Additional Equipment:   Intra-op Plan:   Post-operative Plan: Extubation in OR  Informed Consent: I have reviewed the patients History and Physical, chart, labs and discussed the procedure including the risks, benefits and alternatives for the proposed anesthesia with the patient or authorized representative who has indicated his/her understanding and acceptance.     Dental advisory given  Plan Discussed with: Anesthesiologist and CRNA  Anesthesia Plan Comments:         Anesthesia Quick Evaluation

## 2021-06-29 NOTE — Transfer of Care (Signed)
Immediate Anesthesia Transfer of Care Note  Patient: Keith Chan  Procedure(s) Performed: COLONOSCOPY WITH PROPOFOL  Patient Location: PACU  Anesthesia Type:General  Level of Consciousness: sedated  Airway & Oxygen Therapy: Patient Spontanous Breathing and Patient connected to nasal cannula oxygen  Post-op Assessment: Report given to RN and Post -op Vital signs reviewed and stable  Post vital signs: Reviewed and stable  Last Vitals:  Vitals Value Taken Time  BP 107/78 06/29/21 1428  Temp    Pulse 75 06/29/21 1429  Resp 15 06/29/21 1429  SpO2 95 % 06/29/21 1429  Vitals shown include unvalidated device data.  Last Pain:  Vitals:   06/29/21 1306  TempSrc: Temporal  PainSc: 0-No pain         Complications: No notable events documented.

## 2021-06-30 ENCOUNTER — Encounter: Payer: Self-pay | Admitting: Internal Medicine

## 2021-06-30 NOTE — Anesthesia Postprocedure Evaluation (Signed)
Anesthesia Post Note  Patient: Keith Chan  Procedure(s) Performed: COLONOSCOPY WITH PROPOFOL  Patient location during evaluation: Endoscopy Anesthesia Type: General Level of consciousness: awake and alert Pain management: pain level controlled Vital Signs Assessment: post-procedure vital signs reviewed and stable Respiratory status: spontaneous breathing, nonlabored ventilation, respiratory function stable and patient connected to nasal cannula oxygen Cardiovascular status: blood pressure returned to baseline and stable Postop Assessment: no apparent nausea or vomiting Anesthetic complications: no   No notable events documented.   Last Vitals:  Vitals:   06/29/21 1430 06/29/21 1440  BP:  130/68  Pulse:    Resp:    Temp: 36.8 C   SpO2:      Last Pain:  Vitals:   06/29/21 1450  TempSrc:   PainSc: 0-No pain                 Martha Clan

## 2021-07-01 LAB — SURGICAL PATHOLOGY

## 2021-08-27 ENCOUNTER — Encounter: Payer: Self-pay | Admitting: Urology

## 2021-08-27 ENCOUNTER — Ambulatory Visit (INDEPENDENT_AMBULATORY_CARE_PROVIDER_SITE_OTHER): Payer: 59 | Admitting: Urology

## 2021-08-27 ENCOUNTER — Other Ambulatory Visit: Payer: Self-pay

## 2021-08-27 VITALS — BP 168/93 | HR 72 | Ht 62.0 in | Wt 180.0 lb

## 2021-08-27 DIAGNOSIS — R972 Elevated prostate specific antigen [PSA]: Secondary | ICD-10-CM | POA: Diagnosis not present

## 2021-08-27 NOTE — Progress Notes (Signed)
08/27/2021 8:27 AM   Keith Chan Dec 08, 1960 [id number]  Referring provider: Baxter Hire, MD [place],  [place] [address]  Chief Complaint  Patient presents with   Elevated PSA    HPI: Keith Chan is a 60 y.o. male referred for evaluation of an elevated PSA.  He presents today with his wife.  PSA 01/19/2021 elevated at 5.68 Repeat PSA 07/20/2021 increased to 6.86 Prior PSA in 2017 was 1.63 and 3.56 in 2019 No prior urologic problems or evaluation Mild LUTS x2 years including urinary frequency, urgency, hesitancy Denies recurrent UTI, dysuria or gross hematuria On Plavix for coronary artery disease Father diagnosed with prostate cancer in his 56s   PMH: Past Medical History:  Diagnosis Date   Anginal pain ([place])    Anxiety    Arthritis    "back; hands/wrists" (0/05/8674)   Complication of anesthesia    Coronary artery disease    Family history of anesthesia complication    Mother- N/V   GERD (gastroesophageal reflux disease)    H/O hiatal hernia    High cholesterol    Hypertension    PONV (postoperative nausea and vomiting)    Sleep apnea     Surgical History: Past Surgical History:  Procedure Laterality Date   CARDIAC CATHETERIZATION  11/29/2005   "looked around; qthing was fine"   COLONOSCOPY W/ POLYPECTOMY     COLONOSCOPY WITH PROPOFOL N/A 06/29/2021   Procedure: COLONOSCOPY WITH PROPOFOL;  Surgeon: Toledo, Benay Pike, MD;  Location: [facility] END[facility]COPY;  Service: Gastroenterology;  Laterality: N/A;   CORONARY ANGIOPLASTY WITH STENT PLACEMENT  10/19/2004   MId- Prox RCA  @ [place]   ESOPHAGOGASTRODUODENOSCOPY     LEFT HEART CATH AND CORONARY ANGIOGRAPHY N/A 04/22/2017   Procedure: Left Heart Cath and Coronary Angiography;  Surgeon: Isaias Cowman, MD;  Location: [facility];  Service: Cardiovascular;  Laterality: N/A;   POSTERIOR LUMBAR FUSION  08/07/2014   L5-S1    Home Medications:  Allergies as of 08/27/2021        Reactions   Hydrocodone-acetaminophen Anaphylaxis   Throat closes up   Codeine Sulfate Other (See Comments)   Felt like throat was closing up.   Gemfibrozil Other (See Comments)   Muscle pain        Medication List        Accurate as of August 27, 2021  8:27 AM. If you have any questions, ask your nurse or doctor.          albuterol 108 (90 Base) MCG/ACT inhaler Commonly known as: VENTOLIN HFA Inhale 2 puffs into the lungs every 6 (six) hours as needed for wheezing or shortness of breath.   amLODipine 10 MG tablet Commonly known as: NORVASC Take 1 tablet by mouth daily.   aspirin 81 MG chewable tablet Chew 81 mg by mouth daily.   atenolol 100 MG tablet Commonly known as: TENORMIN Take 100 mg by mouth daily.   atorvastatin 20 MG tablet Commonly known as: LIPITOR Take 20 mg by mouth daily.   clopidogrel 75 MG tablet Commonly known as: PLAVIX Take 75 mg by mouth daily.   hydrochlorothiazide 25 MG tablet Commonly known as: HYDRODIURIL Take 1 tablet by mouth daily.   ibuprofen 200 MG tablet Commonly known as: ADVIL Take 200 mg by mouth every 6 (six) hours as needed.   losartan 50 MG tablet Commonly known as: COZAAR Take by mouth.   nitroGLYCERIN 0.4 MG SL tablet Commonly known as: NITROSTAT Place  1 tablet (0.4 mg total) under the tongue every 5 (five) minutes as needed for chest pain.   pantoprazole 40 MG tablet Commonly known as: PROTONIX Take 40 mg by mouth daily.        Allergies:  Allergies  Allergen Reactions   Hydrocodone-Acetaminophen Anaphylaxis    Throat closes up   Codeine Sulfate Other (See Comments)    Felt like throat was closing up.   Gemfibrozil Other (See Comments)    Muscle pain    Family History: Family History  Problem Relation Age of Onset   CAD Mother    Hyperlipidemia Mother    CAD Father    Hyperlipidemia Father     Social History:  reports that he quit smoking about 40 years ago. His smoking use included  cigarettes. He has a 18.00 pack-year smoking history. He has never used smokeless tobacco. He reports current alcohol use of about 28.0 standard drinks per week. He reports current drug use. Drug: Marijuana.   Physical Exam: BP (!) 168/93   Pulse 72   Ht 5\' 2"  (1.575 m)   Wt 180 lb (81.6 kg)   BMI 32.92 kg/m   Constitutional:  Alert and oriented, No acute distress. HEENT: [place] AT, moist mucus membranes.  Trachea midline, no masses. Cardiovascular: No clubbing, cyanosis, or edema. Respiratory: Normal respiratory effort, no increased work of breathing. GI: Abdomen is soft, nontender, nondistended, no abdominal masses GU: Prostate 30 g, smooth without nodules Psychiatric: Normal mood and affect.   Assessment & Plan:    1.  Elevated PSA Although PSA is a prostate cancer screening test he was informed that cancer is not the most common cause of an elevated PSA. Other potential causes including BPH and inflammation were discussed. He was informed that the only way to adequately diagnose prostate cancer would be a transrectal ultrasound and biopsy of the prostate. The procedure was discussed including potential risks of bleeding and infection/sepsis. He was also informed that a negative biopsy does not conclusively rule out the possibility that prostate cancer may be present and that continued monitoring is required. The use of newer adjunctive blood tests including PHI and 4kScore were discussed. The use of multiparametric prostate MRI to identify lesions suspicious for high risk prostate cancer and subsequent targeted biopsy was discussed. Continued periodic surveillance was also discussed. I have initially recommended a prostate MRI.  We discussed the false-negative rate of MRI and if MRI negative would recommend standard template biopsy.  They would like to proceed with MRI.   Abbie Sons, [facility] [address], [place], [place] [address] [phone]

## 2021-09-03 ENCOUNTER — Ambulatory Visit: Payer: BC Managed Care – PPO | Admitting: Dermatology

## 2021-09-24 ENCOUNTER — Other Ambulatory Visit: Payer: Self-pay

## 2021-09-24 ENCOUNTER — Ambulatory Visit
Admission: RE | Admit: 2021-09-24 | Discharge: 2021-09-24 | Disposition: A | Discharge status: Home / Self Care | Payer: 59 | Source: Ambulatory Visit | Attending: Urology | Admitting: Urology

## 2021-09-24 DIAGNOSIS — R972 Elevated prostate specific antigen [PSA]: Secondary | ICD-10-CM | POA: Diagnosis not present

## 2021-09-24 MED ORDER — GADOBUTROL 1 MMOL/ML IV SOLN
7.5000 mL | Freq: Once | INTRAVENOUS | Status: AC | PRN
  Administered 2021-09-24: 7.5 mL via INTRAVENOUS

## 2021-09-28 ENCOUNTER — Encounter: Payer: Self-pay | Admitting: Urology

## 2021-09-28 ENCOUNTER — Telehealth: Payer: Self-pay

## 2021-09-28 DIAGNOSIS — R935 Abnormal findings on diagnostic imaging of other abdominal regions, including retroperitoneum: Secondary | ICD-10-CM

## 2021-09-28 DIAGNOSIS — R972 Elevated prostate specific antigen [PSA]: Secondary | ICD-10-CM

## 2021-09-28 NOTE — Telephone Encounter (Signed)
Pt called looking for MRI results

## 2021-09-29 NOTE — Telephone Encounter (Signed)
Pt's wife called VERY upset because they saw MRI results on [facility] and no one from office has contacted pt.  They would like a return call ASAP concerning MRI results and want to know what to do next.

## 2021-09-29 NOTE — Telephone Encounter (Signed)
I spoke with Mr. Baugh and his wife regarding his prostate MRI report.  Prostate volume was 27 cc.  There were PI-RADS 5 lesions x2 in the left anterior/posterolateral peripheral zone extending from base to apex and the second lesion in the right posterolateral/posteromedial peripheral zone in the mid gland and apex.  No pelvic adenopathy or seminal vesicle involvement noted.  No definite evidence of extracapsular extension though the left lesion had capsular contact.  Discussed scheduling a fusion biopsy at [facility] in [place].  The MR findings would be amenable to a cognitive biopsy however they would like to proceed with the fusion biopsy.  Referral was placed and he will follow-up for the biopsy results.

## 2021-10-02 ENCOUNTER — Ambulatory Visit: Payer: 59 | Admitting: Urology

## 2021-11-03 ENCOUNTER — Other Ambulatory Visit: Payer: Self-pay | Admitting: Urology

## 2021-11-04 ENCOUNTER — Other Ambulatory Visit: Payer: Self-pay

## 2021-11-04 ENCOUNTER — Encounter: Payer: Self-pay | Admitting: Urology

## 2021-11-04 ENCOUNTER — Ambulatory Visit (INDEPENDENT_AMBULATORY_CARE_PROVIDER_SITE_OTHER): Payer: 59 | Admitting: Urology

## 2021-11-04 VITALS — BP 138/87 | HR 85 | Ht 70.0 in | Wt 180.0 lb

## 2021-11-04 DIAGNOSIS — C61 Malignant neoplasm of prostate: Secondary | ICD-10-CM | POA: Diagnosis not present

## 2021-11-04 NOTE — Progress Notes (Addendum)
11/04/2021 12:13 PM   Keith Chan [facility] 1961/03/24 [id number]  Referring provider: Baxter Hire, MD [place],  [place] [address]  Chief Complaint  Patient presents with   Follow-up    HPI: 60 y.o. male presents for prostate biopsy follow-up.  He presents with his wife.  PSA 6.86 Benign DRE Prostate MRI 09/2021 27 cc volume; PI-RADS 5 lesions x2 extending base to apex bilaterally No pelvic adenopathy, evidence seminal vesicle invasion or extracapsular extension MR fusion biopsy [facility] 10/29/2021; TRUS volume 32 cc; 4 biopsies each ROI plus standard template biopsies performed No postbiopsy complaints Path: 20/20 cores positive for Gleason 4+5/4+3/3+4 adenocarcinoma     PMH: Past Medical History:  Diagnosis Date   Anginal pain ([place])    Anxiety    Arthritis    "back; hands/wrists" (06/01/1286)   Complication of anesthesia    Coronary artery disease    Family history of anesthesia complication    Mother- N/V   GERD (gastroesophageal reflux disease)    H/O hiatal hernia    High cholesterol    Hypertension    PONV (postoperative nausea and vomiting)    Sleep apnea     Surgical History: Past Surgical History:  Procedure Laterality Date   CARDIAC CATHETERIZATION  11/29/2005   "looked around; qthing was fine"   COLONOSCOPY W/ POLYPECTOMY     COLONOSCOPY WITH PROPOFOL N/A 06/29/2021   Procedure: COLONOSCOPY WITH PROPOFOL;  Surgeon: Toledo, Benay Pike, MD;  Location: [facility] END[facility]COPY;  Service: Gastroenterology;  Laterality: N/A;   CORONARY ANGIOPLASTY WITH STENT PLACEMENT  10/19/2004   MId- Prox RCA  @ [place]   ESOPHAGOGASTRODUODENOSCOPY     LEFT HEART CATH AND CORONARY ANGIOGRAPHY N/A 04/22/2017   Procedure: Left Heart Cath and Coronary Angiography;  Surgeon: Isaias Cowman, MD;  Location: [facility];  Service: Cardiovascular;  Laterality: N/A;   POSTERIOR LUMBAR FUSION  08/07/2014   L5-S1    Home Medications:  Allergies as of  11/04/2021       Reactions   Hydrocodone-acetaminophen Anaphylaxis   Throat closes up   Codeine Sulfate Other (See Comments)   Felt like throat was closing up.   Gemfibrozil Other (See Comments)   Muscle pain        Medication List        Accurate as of November 04, 2021 11:59 PM. If you have any questions, ask your nurse or doctor.          albuterol 108 (90 Base) MCG/ACT inhaler Commonly known as: VENTOLIN HFA Inhale 2 puffs into the lungs every 6 (six) hours as needed for wheezing or shortness of breath.   amLODipine 10 MG tablet Commonly known as: NORVASC Take 1 tablet by mouth daily.   aspirin 81 MG chewable tablet Chew 81 mg by mouth daily.   atenolol 100 MG tablet Commonly known as: TENORMIN Take 100 mg by mouth daily.   atorvastatin 20 MG tablet Commonly known as: LIPITOR Take 20 mg by mouth daily.   clopidogrel 75 MG tablet Commonly known as: PLAVIX Take 75 mg by mouth daily.   hydrochlorothiazide 25 MG tablet Commonly known as: HYDRODIURIL Take 1 tablet by mouth daily.   ibuprofen 200 MG tablet Commonly known as: ADVIL Take 200 mg by mouth every 6 (six) hours as needed.   losartan 50 MG tablet Commonly known as: COZAAR Take by mouth.   nitroGLYCERIN 0.4 MG SL tablet Commonly known as: NITROSTAT Place 1 tablet (0.4 mg total) under  the tongue every 5 (five) minutes as needed for chest pain.   pantoprazole 40 MG tablet Commonly known as: PROTONIX Take 40 mg by mouth daily.        Allergies:  Allergies  Allergen Reactions   Hydrocodone-Acetaminophen Anaphylaxis    Throat closes up   Codeine Sulfate Other (See Comments)    Felt like throat was closing up.   Gemfibrozil Other (See Comments)    Muscle pain    Family History: Family History  Problem Relation Age of Onset   CAD Mother    Hyperlipidemia Mother    CAD Father    Hyperlipidemia Father     Social History:   reports that he quit smoking about 40 years ago. His  smoking use included cigarettes. He has a 18.00 pack-year smoking history. He has never used smokeless tobacco. He reports current alcohol use of about 2.0 standard drinks per week. He reports that he does not currently use drugs.  Physical Exam: BP 138/87   Pulse 85   Ht 5\' 10"  (1.778 m)   Wt 180 lb (81.6 kg)   BMI 25.83 kg/m   Constitutional:  Alert and oriented, No acute distress. HEENT: [place] AT, moist mucus membranes.  Trachea midline, no masses. Cardiovascular: No clubbing, cyanosis, or edema. Respiratory: Normal respiratory effort, no increased work of breathing. Psychiatric: Normal mood and affect.   Assessment & Plan:    cT1c N0 Mx adenocarcinoma prostate (NCCN very high risk) The pathology report was discussed in detail with Mr. Stay and his wife He has high-volume, high risk disease Prostate MRI showed no evidence of extracapsular extension or pelvic adenopathy.  We discussed he does have a significant chance of having microscopic extracapsular extension Recommend PSMA/PET to evaluate for metastatic disease; curative treatment would be recommended if no evidence of metastatic disease We discussed curative options including EBRT + ADT and EBRT + brachytherapy + ADT.  Radical prostatectomy + PLND were also discussed We also discussed tertiary referral for multidisciplinary oncology clinic Will contact with PSMA results and further discussion at that time  I spent 33 total minutes on the day of the encounter including pre-visit review of the medical record, face-to-face time with the patient, and post visit ordering of labs/imaging/tests.   Abbie Sons, [facility] [address] [place], [place] [address] [phone]

## 2021-11-05 ENCOUNTER — Telehealth: Payer: Self-pay

## 2021-11-05 NOTE — Telephone Encounter (Signed)
Patient calls triage line and states that he has reviewed his most recent office note and there is information listed in his social history that is incorrect. It currently states that he is using marijuana and drinking 28 cans of beer weekly. He states this is incorrect, he has not used marijuana since he was 60yrs old and occasionally has 1-2 cans of beer. I have updated this information in his social history to reflect the accurate information. Can you please addend note per pt's request.

## 2021-11-09 ENCOUNTER — Other Ambulatory Visit: Payer: Self-pay | Admitting: Urology

## 2021-11-09 DIAGNOSIS — C61 Malignant neoplasm of prostate: Secondary | ICD-10-CM

## 2021-11-12 ENCOUNTER — Encounter
Admission: RE | Admit: 2021-11-12 | Discharge: 2021-11-12 | Disposition: A | Discharge status: Home / Self Care | Payer: 59 | Source: Ambulatory Visit | Attending: Urology | Admitting: Urology

## 2021-11-12 ENCOUNTER — Other Ambulatory Visit: Payer: Self-pay

## 2021-11-12 DIAGNOSIS — C61 Malignant neoplasm of prostate: Secondary | ICD-10-CM | POA: Insufficient documentation

## 2021-11-12 MED ORDER — PIFLIFOLASTAT F 18 (PYLARIFY) INJECTION
9.0000 | Freq: Once | INTRAVENOUS | Status: AC
  Administered 2021-11-12: 9.05 via INTRAVENOUS

## 2021-11-14 ENCOUNTER — Encounter: Payer: Self-pay | Admitting: Urology

## 2021-12-11 DIAGNOSIS — C61 Malignant neoplasm of prostate: Secondary | ICD-10-CM | POA: Diagnosis not present

## 2021-12-15 DIAGNOSIS — C61 Malignant neoplasm of prostate: Secondary | ICD-10-CM | POA: Diagnosis not present

## 2021-12-25 DIAGNOSIS — Z01818 Encounter for other preprocedural examination: Secondary | ICD-10-CM | POA: Diagnosis not present

## 2021-12-25 DIAGNOSIS — I2510 Atherosclerotic heart disease of native coronary artery without angina pectoris: Secondary | ICD-10-CM | POA: Diagnosis not present

## 2021-12-25 DIAGNOSIS — I10 Essential (primary) hypertension: Secondary | ICD-10-CM | POA: Diagnosis not present

## 2021-12-25 DIAGNOSIS — C61 Malignant neoplasm of prostate: Secondary | ICD-10-CM | POA: Diagnosis not present

## 2021-12-25 DIAGNOSIS — E785 Hyperlipidemia, unspecified: Secondary | ICD-10-CM | POA: Diagnosis not present

## 2021-12-25 DIAGNOSIS — G4733 Obstructive sleep apnea (adult) (pediatric): Secondary | ICD-10-CM | POA: Diagnosis not present

## 2021-12-25 DIAGNOSIS — K219 Gastro-esophageal reflux disease without esophagitis: Secondary | ICD-10-CM | POA: Diagnosis not present

## 2021-12-29 DIAGNOSIS — Z20822 Contact with and (suspected) exposure to covid-19: Secondary | ICD-10-CM | POA: Diagnosis not present

## 2022-01-01 DIAGNOSIS — Z79899 Other long term (current) drug therapy: Secondary | ICD-10-CM | POA: Diagnosis not present

## 2022-01-01 DIAGNOSIS — C61 Malignant neoplasm of prostate: Secondary | ICD-10-CM | POA: Diagnosis not present

## 2022-01-01 DIAGNOSIS — E782 Mixed hyperlipidemia: Secondary | ICD-10-CM | POA: Diagnosis not present

## 2022-01-01 DIAGNOSIS — E785 Hyperlipidemia, unspecified: Secondary | ICD-10-CM | POA: Diagnosis not present

## 2022-01-01 DIAGNOSIS — Z981 Arthrodesis status: Secondary | ICD-10-CM | POA: Diagnosis not present

## 2022-01-01 DIAGNOSIS — Z885 Allergy status to narcotic agent status: Secondary | ICD-10-CM | POA: Diagnosis not present

## 2022-01-01 DIAGNOSIS — Z955 Presence of coronary angioplasty implant and graft: Secondary | ICD-10-CM | POA: Diagnosis not present

## 2022-01-01 DIAGNOSIS — Z87891 Personal history of nicotine dependence: Secondary | ICD-10-CM | POA: Diagnosis not present

## 2022-01-01 DIAGNOSIS — Z7902 Long term (current) use of antithrombotics/antiplatelets: Secondary | ICD-10-CM | POA: Diagnosis not present

## 2022-01-01 DIAGNOSIS — I10 Essential (primary) hypertension: Secondary | ICD-10-CM | POA: Diagnosis not present

## 2022-01-01 DIAGNOSIS — G4733 Obstructive sleep apnea (adult) (pediatric): Secondary | ICD-10-CM | POA: Diagnosis not present

## 2022-01-01 DIAGNOSIS — Z87892 Personal history of anaphylaxis: Secondary | ICD-10-CM | POA: Diagnosis not present

## 2022-01-01 DIAGNOSIS — I2510 Atherosclerotic heart disease of native coronary artery without angina pectoris: Secondary | ICD-10-CM | POA: Diagnosis not present

## 2022-01-01 DIAGNOSIS — K219 Gastro-esophageal reflux disease without esophagitis: Secondary | ICD-10-CM | POA: Diagnosis not present

## 2022-01-01 DIAGNOSIS — Z7982 Long term (current) use of aspirin: Secondary | ICD-10-CM | POA: Diagnosis not present

## 2022-01-08 DIAGNOSIS — C61 Malignant neoplasm of prostate: Secondary | ICD-10-CM | POA: Diagnosis not present

## 2022-01-20 DIAGNOSIS — Z125 Encounter for screening for malignant neoplasm of prostate: Secondary | ICD-10-CM | POA: Diagnosis not present

## 2022-01-20 DIAGNOSIS — I10 Essential (primary) hypertension: Secondary | ICD-10-CM | POA: Diagnosis not present

## 2022-02-22 DIAGNOSIS — C61 Malignant neoplasm of prostate: Secondary | ICD-10-CM | POA: Diagnosis not present

## 2022-02-24 DIAGNOSIS — U071 COVID-19: Secondary | ICD-10-CM | POA: Diagnosis not present

## 2022-04-12 DIAGNOSIS — R509 Fever, unspecified: Secondary | ICD-10-CM | POA: Diagnosis not present

## 2022-04-12 DIAGNOSIS — R059 Cough, unspecified: Secondary | ICD-10-CM | POA: Diagnosis not present

## 2022-04-12 DIAGNOSIS — R051 Acute cough: Secondary | ICD-10-CM | POA: Diagnosis not present

## 2022-04-12 DIAGNOSIS — D72829 Elevated white blood cell count, unspecified: Secondary | ICD-10-CM | POA: Diagnosis not present

## 2022-04-12 DIAGNOSIS — Z03818 Encounter for observation for suspected exposure to other biological agents ruled out: Secondary | ICD-10-CM | POA: Diagnosis not present

## 2022-05-17 DIAGNOSIS — Z9889 Other specified postprocedural states: Secondary | ICD-10-CM | POA: Diagnosis not present

## 2022-05-17 DIAGNOSIS — I10 Essential (primary) hypertension: Secondary | ICD-10-CM | POA: Diagnosis not present

## 2022-05-17 DIAGNOSIS — I2510 Atherosclerotic heart disease of native coronary artery without angina pectoris: Secondary | ICD-10-CM | POA: Diagnosis not present

## 2022-05-17 DIAGNOSIS — C61 Malignant neoplasm of prostate: Secondary | ICD-10-CM | POA: Diagnosis not present

## 2022-05-17 DIAGNOSIS — G4733 Obstructive sleep apnea (adult) (pediatric): Secondary | ICD-10-CM | POA: Diagnosis not present

## 2022-05-17 DIAGNOSIS — E785 Hyperlipidemia, unspecified: Secondary | ICD-10-CM | POA: Diagnosis not present

## 2022-05-25 DIAGNOSIS — N5231 Erectile dysfunction following radical prostatectomy: Secondary | ICD-10-CM | POA: Diagnosis not present

## 2022-05-25 DIAGNOSIS — C61 Malignant neoplasm of prostate: Secondary | ICD-10-CM | POA: Diagnosis not present

## 2022-07-22 DIAGNOSIS — I10 Essential (primary) hypertension: Secondary | ICD-10-CM | POA: Diagnosis not present

## 2022-07-29 DIAGNOSIS — I10 Essential (primary) hypertension: Secondary | ICD-10-CM | POA: Diagnosis not present

## 2022-07-29 DIAGNOSIS — Z125 Encounter for screening for malignant neoplasm of prostate: Secondary | ICD-10-CM | POA: Diagnosis not present

## 2022-07-29 DIAGNOSIS — E785 Hyperlipidemia, unspecified: Secondary | ICD-10-CM | POA: Diagnosis not present

## 2022-07-29 DIAGNOSIS — K219 Gastro-esophageal reflux disease without esophagitis: Secondary | ICD-10-CM | POA: Diagnosis not present

## 2022-07-29 DIAGNOSIS — G4733 Obstructive sleep apnea (adult) (pediatric): Secondary | ICD-10-CM | POA: Diagnosis not present

## 2022-07-29 DIAGNOSIS — I2510 Atherosclerotic heart disease of native coronary artery without angina pectoris: Secondary | ICD-10-CM | POA: Diagnosis not present

## 2022-08-20 DIAGNOSIS — C61 Malignant neoplasm of prostate: Secondary | ICD-10-CM | POA: Diagnosis not present

## 2022-11-04 DIAGNOSIS — Z23 Encounter for immunization: Secondary | ICD-10-CM | POA: Diagnosis not present

## 2022-11-04 DIAGNOSIS — I10 Essential (primary) hypertension: Secondary | ICD-10-CM | POA: Diagnosis not present

## 2022-11-04 DIAGNOSIS — E785 Hyperlipidemia, unspecified: Secondary | ICD-10-CM | POA: Diagnosis not present

## 2022-11-04 DIAGNOSIS — I2510 Atherosclerotic heart disease of native coronary artery without angina pectoris: Secondary | ICD-10-CM | POA: Diagnosis not present

## 2022-11-04 DIAGNOSIS — G4733 Obstructive sleep apnea (adult) (pediatric): Secondary | ICD-10-CM | POA: Diagnosis not present

## 2022-11-08 DIAGNOSIS — C61 Malignant neoplasm of prostate: Secondary | ICD-10-CM | POA: Diagnosis not present

## 2023-01-24 DIAGNOSIS — Z125 Encounter for screening for malignant neoplasm of prostate: Secondary | ICD-10-CM | POA: Diagnosis not present

## 2023-01-24 DIAGNOSIS — I10 Essential (primary) hypertension: Secondary | ICD-10-CM | POA: Diagnosis not present

## 2023-02-15 DIAGNOSIS — Z08 Encounter for follow-up examination after completed treatment for malignant neoplasm: Secondary | ICD-10-CM | POA: Diagnosis not present

## 2023-02-15 DIAGNOSIS — C61 Malignant neoplasm of prostate: Secondary | ICD-10-CM | POA: Diagnosis not present

## 2023-02-15 DIAGNOSIS — Z87891 Personal history of nicotine dependence: Secondary | ICD-10-CM | POA: Diagnosis not present

## 2023-02-15 DIAGNOSIS — Z23 Encounter for immunization: Secondary | ICD-10-CM | POA: Diagnosis not present

## 2023-02-15 DIAGNOSIS — Z8546 Personal history of malignant neoplasm of prostate: Secondary | ICD-10-CM | POA: Diagnosis not present

## 2023-05-05 DIAGNOSIS — I10 Essential (primary) hypertension: Secondary | ICD-10-CM | POA: Diagnosis not present

## 2023-06-08 DIAGNOSIS — Z833 Family history of diabetes mellitus: Secondary | ICD-10-CM | POA: Diagnosis not present

## 2023-06-08 DIAGNOSIS — G4733 Obstructive sleep apnea (adult) (pediatric): Secondary | ICD-10-CM | POA: Diagnosis not present

## 2023-06-08 DIAGNOSIS — K219 Gastro-esophageal reflux disease without esophagitis: Secondary | ICD-10-CM | POA: Diagnosis not present

## 2023-06-08 DIAGNOSIS — Z8249 Family history of ischemic heart disease and other diseases of the circulatory system: Secondary | ICD-10-CM | POA: Diagnosis not present

## 2023-06-08 DIAGNOSIS — M1990 Unspecified osteoarthritis, unspecified site: Secondary | ICD-10-CM | POA: Diagnosis not present

## 2023-06-08 DIAGNOSIS — Z7982 Long term (current) use of aspirin: Secondary | ICD-10-CM | POA: Diagnosis not present

## 2023-06-08 DIAGNOSIS — Z87891 Personal history of nicotine dependence: Secondary | ICD-10-CM | POA: Diagnosis not present

## 2023-06-08 DIAGNOSIS — I10 Essential (primary) hypertension: Secondary | ICD-10-CM | POA: Diagnosis not present

## 2023-06-08 DIAGNOSIS — E785 Hyperlipidemia, unspecified: Secondary | ICD-10-CM | POA: Diagnosis not present

## 2023-06-08 DIAGNOSIS — Z809 Family history of malignant neoplasm, unspecified: Secondary | ICD-10-CM | POA: Diagnosis not present

## 2023-06-08 DIAGNOSIS — Z7902 Long term (current) use of antithrombotics/antiplatelets: Secondary | ICD-10-CM | POA: Diagnosis not present

## 2023-06-08 DIAGNOSIS — Z008 Encounter for other general examination: Secondary | ICD-10-CM | POA: Diagnosis not present

## 2023-06-08 DIAGNOSIS — N529 Male erectile dysfunction, unspecified: Secondary | ICD-10-CM | POA: Diagnosis not present

## 2023-07-28 DIAGNOSIS — E785 Hyperlipidemia, unspecified: Secondary | ICD-10-CM | POA: Diagnosis not present

## 2023-08-04 DIAGNOSIS — G4733 Obstructive sleep apnea (adult) (pediatric): Secondary | ICD-10-CM | POA: Diagnosis not present

## 2023-08-04 DIAGNOSIS — I2510 Atherosclerotic heart disease of native coronary artery without angina pectoris: Secondary | ICD-10-CM | POA: Diagnosis not present

## 2023-08-04 DIAGNOSIS — E785 Hyperlipidemia, unspecified: Secondary | ICD-10-CM | POA: Diagnosis not present

## 2023-08-04 DIAGNOSIS — I10 Essential (primary) hypertension: Secondary | ICD-10-CM | POA: Diagnosis not present

## 2023-08-04 DIAGNOSIS — Z125 Encounter for screening for malignant neoplasm of prostate: Secondary | ICD-10-CM | POA: Diagnosis not present

## 2023-08-04 DIAGNOSIS — K219 Gastro-esophageal reflux disease without esophagitis: Secondary | ICD-10-CM | POA: Diagnosis not present

## 2023-08-31 DIAGNOSIS — C61 Malignant neoplasm of prostate: Secondary | ICD-10-CM | POA: Diagnosis not present

## 2025-01-01 ENCOUNTER — Other Ambulatory Visit: Payer: Self-pay | Admitting: Student

## 2025-01-01 ENCOUNTER — Encounter: Payer: Self-pay | Admitting: Student

## 2025-01-01 DIAGNOSIS — K1121 Acute sialoadenitis: Secondary | ICD-10-CM

## 2025-01-01 DIAGNOSIS — R221 Localized swelling, mass and lump, neck: Secondary | ICD-10-CM

## 2025-01-02 ENCOUNTER — Other Ambulatory Visit: Payer: Self-pay | Admitting: Student

## 2025-01-02 ENCOUNTER — Ambulatory Visit: Admission: RE | Admit: 2025-01-02 | Discharge: 2025-01-02 | Attending: Student

## 2025-01-02 DIAGNOSIS — R221 Localized swelling, mass and lump, neck: Secondary | ICD-10-CM

## 2025-01-02 DIAGNOSIS — K1121 Acute sialoadenitis: Secondary | ICD-10-CM

## 2025-01-04 ENCOUNTER — Other Ambulatory Visit: Payer: Self-pay | Admitting: Student

## 2025-01-04 DIAGNOSIS — R221 Localized swelling, mass and lump, neck: Secondary | ICD-10-CM
